# Patient Record
Sex: Female | Born: 1965 | Race: White | Hispanic: No | Smoking: Never smoker
Health system: Southern US, Community
[De-identification: ages and names within clinical notes are randomized; demographics above are authoritative.]

## PROBLEM LIST (undated history)

## (undated) DIAGNOSIS — F32A Depression, unspecified: Secondary | ICD-10-CM

## (undated) DIAGNOSIS — F419 Anxiety disorder, unspecified: Principal | ICD-10-CM

## (undated) HISTORY — DX: Anxiety disorder, unspecified: F41.9

## (undated) HISTORY — DX: Depression, unspecified: F32.A

## (undated) HISTORY — PX: TUBAL LIGATION: SHX77

---

## 1993-05-12 DIAGNOSIS — F419 Anxiety disorder, unspecified: Secondary | ICD-10-CM

## 1993-05-12 HISTORY — DX: Anxiety disorder, unspecified: F41.9

## 2002-08-17 ENCOUNTER — Encounter: Payer: Self-pay | Admitting: Internal Medicine

## 2002-08-17 ENCOUNTER — Ambulatory Visit (HOSPITAL_COMMUNITY): Admission: RE | Admit: 2002-08-17 | Discharge: 2002-08-17 | Payer: Self-pay | Admitting: Internal Medicine

## 2002-10-13 ENCOUNTER — Ambulatory Visit (HOSPITAL_COMMUNITY): Admission: RE | Admit: 2002-10-13 | Discharge: 2002-10-13 | Payer: Self-pay | Admitting: Internal Medicine

## 2002-10-13 ENCOUNTER — Encounter: Payer: Self-pay | Admitting: Internal Medicine

## 2013-11-10 ENCOUNTER — Other Ambulatory Visit (HOSPITAL_COMMUNITY): Payer: Self-pay | Admitting: *Deleted

## 2013-11-10 DIAGNOSIS — Z1231 Encounter for screening mammogram for malignant neoplasm of breast: Secondary | ICD-10-CM

## 2013-11-21 ENCOUNTER — Ambulatory Visit (HOSPITAL_COMMUNITY)
Admission: RE | Admit: 2013-11-21 | Discharge: 2013-11-21 | Disposition: A | Discharge status: Home / Self Care | Payer: PRIVATE HEALTH INSURANCE | Source: Ambulatory Visit | Attending: *Deleted | Admitting: *Deleted

## 2013-11-21 DIAGNOSIS — Z1231 Encounter for screening mammogram for malignant neoplasm of breast: Secondary | ICD-10-CM | POA: Insufficient documentation

## 2015-10-25 ENCOUNTER — Other Ambulatory Visit (HOSPITAL_COMMUNITY): Payer: Self-pay | Admitting: *Deleted

## 2015-10-25 DIAGNOSIS — Z1231 Encounter for screening mammogram for malignant neoplasm of breast: Secondary | ICD-10-CM

## 2015-11-01 ENCOUNTER — Ambulatory Visit (HOSPITAL_COMMUNITY)
Admission: RE | Admit: 2015-11-01 | Discharge: 2015-11-01 | Disposition: A | Discharge status: Home / Self Care | Payer: Self-pay | Source: Ambulatory Visit | Attending: *Deleted | Admitting: *Deleted

## 2015-11-01 DIAGNOSIS — Z1231 Encounter for screening mammogram for malignant neoplasm of breast: Secondary | ICD-10-CM

## 2016-06-09 ENCOUNTER — Emergency Department (HOSPITAL_COMMUNITY): Payer: No Typology Code available for payment source

## 2016-06-09 ENCOUNTER — Encounter (HOSPITAL_COMMUNITY): Payer: Self-pay | Admitting: Emergency Medicine

## 2016-06-09 ENCOUNTER — Emergency Department (HOSPITAL_COMMUNITY)
Admission: EM | Admit: 2016-06-09 | Discharge: 2016-06-09 | Disposition: A | Discharge status: Home / Self Care | Payer: No Typology Code available for payment source | Attending: Emergency Medicine | Admitting: Emergency Medicine

## 2016-06-09 DIAGNOSIS — Y9389 Activity, other specified: Secondary | ICD-10-CM | POA: Diagnosis not present

## 2016-06-09 DIAGNOSIS — S161XXA Strain of muscle, fascia and tendon at neck level, initial encounter: Secondary | ICD-10-CM | POA: Insufficient documentation

## 2016-06-09 DIAGNOSIS — Y92410 Unspecified street and highway as the place of occurrence of the external cause: Secondary | ICD-10-CM | POA: Diagnosis not present

## 2016-06-09 DIAGNOSIS — S39012A Strain of muscle, fascia and tendon of lower back, initial encounter: Secondary | ICD-10-CM | POA: Diagnosis not present

## 2016-06-09 DIAGNOSIS — S199XXA Unspecified injury of neck, initial encounter: Secondary | ICD-10-CM | POA: Diagnosis present

## 2016-06-09 DIAGNOSIS — Y999 Unspecified external cause status: Secondary | ICD-10-CM | POA: Insufficient documentation

## 2016-06-09 MED ORDER — ONDANSETRON HCL 4 MG PO TABS: 4.0000 mg | ORAL_TABLET | Freq: Four times a day (QID) | ORAL | 0 refills | Status: DC

## 2016-06-09 MED ORDER — HYDROCODONE-ACETAMINOPHEN 5-325 MG PO TABS: ORAL_TABLET | ORAL | 0 refills | Status: DC

## 2016-06-09 MED ORDER — METHOCARBAMOL 500 MG PO TABS: 500.0000 mg | ORAL_TABLET | Freq: Two times a day (BID) | ORAL | 0 refills | Status: DC
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
## 2016-06-09 NOTE — Discharge Instructions (Signed)
Apply ice packs on/off to your neck and back.  Follow-up with Dr. Scharlene GlossHAll's office in one week if the symptoms are not improving
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
## 2017-03-03 ENCOUNTER — Ambulatory Visit: Payer: Self-pay | Admitting: Physician Assistant

## 2017-03-05 ENCOUNTER — Ambulatory Visit: Payer: Self-pay | Admitting: Physician Assistant

## 2017-03-05 ENCOUNTER — Encounter: Payer: Self-pay | Admitting: Physician Assistant

## 2017-03-05 VITALS — BP 98/60 | HR 92 | Temp 97.7°F | Ht 60.0 in | Wt 147.2 lb

## 2017-03-05 DIAGNOSIS — Z1239 Encounter for other screening for malignant neoplasm of breast: Secondary | ICD-10-CM

## 2017-03-05 DIAGNOSIS — Z131 Encounter for screening for diabetes mellitus: Secondary | ICD-10-CM

## 2017-03-05 DIAGNOSIS — Z130 Encounter for screening for diseases of the blood and blood-forming organs and certain disorders involving the immune mechanism: Secondary | ICD-10-CM

## 2017-03-05 DIAGNOSIS — F419 Anxiety disorder, unspecified: Secondary | ICD-10-CM

## 2017-03-05 DIAGNOSIS — Z13220 Encounter for screening for lipoid disorders: Secondary | ICD-10-CM

## 2017-03-05 DIAGNOSIS — Z1211 Encounter for screening for malignant neoplasm of colon: Secondary | ICD-10-CM

## 2017-03-05 DIAGNOSIS — M79672 Pain in left foot: Secondary | ICD-10-CM

## 2017-03-05 MED ORDER — CITALOPRAM HYDROBROMIDE 20 MG PO TABS: 20.0000 mg | ORAL_TABLET | Freq: Every day | ORAL | 0 refills | Status: DC

## 2017-03-05 NOTE — Progress Notes (Signed)
BP 98/60 (BP Location: Left Arm, Patient Position: Sitting, Cuff Size: Normal)   Pulse 92   Temp 97.7 F (36.5 C)   Ht 5' (1.524 m)   Wt 147 lb 4 oz (66.8 kg)   SpO2 96%   BMI 28.76 kg/m    Subjective:    Patient ID: Kathryn Koch, female    DOB: 1966/05/04, 51 y.o.   MRN: [id number]  HPI: Kathryn Koch is a 51 y.o. female presenting on 03/05/2017 for New Patient (Initial Visit) ([facility] pt c/o L foot pain when standing on it or walking on it.); Allergies; and Panic Attack   HPI   She [facility] because [facility] never do anything.   Pt with loong hx anxiety.  She went to [facility] and she says it wasn't helpful.  She says the panic attacks and anxiety started after the death of her father.  It's worse at bedtime and in the summer.  Also some feelings of claustrophobia.    She says she was on paxil and something else in the past for that and it just made her feel really bad.     Her last PAP was last year.  Last mammo June 2017  Pt c/o pain L foot due to a bone spur.  States it has been present for years with no improvement despite following conservative recommendations including shoes inserts, avoiding walking barefoot, nsaids.   Also c/o ear problems.    Relevant past medical, surgical, family and social history reviewed and updated as indicated. Interim medical history since our last visit reviewed. Allergies and medications reviewed and updated.  No current outpatient prescriptions on file.  Review of Systems  Constitutional: Positive for diaphoresis and fatigue. Negative for appetite change, chills, fever and unexpected weight change.  HENT: Positive for ear pain and sneezing. Negative for congestion, drooling, facial swelling, hearing loss, mouth sores, sore throat, trouble swallowing and voice change.   Eyes: Positive for itching and visual disturbance. Negative for pain, discharge and redness.  Respiratory: Negative for cough, choking,  shortness of breath and wheezing.   Cardiovascular: Negative for chest pain, palpitations and leg swelling.  Gastrointestinal: Negative for abdominal pain, blood in stool, constipation, diarrhea and vomiting.  Endocrine: Positive for heat intolerance. Negative for cold intolerance and polydipsia.  Genitourinary: Negative for decreased urine volume, dysuria and hematuria.  Musculoskeletal: Positive for back pain and gait problem. Negative for arthralgias.  Skin: Negative for rash.  Allergic/Immunologic: Positive for environmental allergies.  Neurological: Positive for headaches. Negative for seizures, syncope and light-headedness.  Hematological: Negative for adenopathy.  Psychiatric/Behavioral: Negative for agitation, dysphoric mood and suicidal ideas. The patient is not nervous/anxious.     Per HPI unless specifically indicated above     Objective:    BP 98/60 (BP Location: Left Arm, Patient Position: Sitting, Cuff Size: Normal)   Pulse 92   Temp 97.7 F (36.5 C)   Ht 5' (1.524 m)   Wt 147 lb 4 oz (66.8 kg)   SpO2 96%   BMI 28.76 kg/m   Wt Readings from Last 3 Encounters:  03/05/17 147 lb 4 oz (66.8 kg)  06/09/16 135 lb (61.2 kg)    GAD-7 score 21 PHQ-9 score 19  Physical Exam  Constitutional: She is oriented to person, place, and time. She appears well-developed and well-nourished.  HENT:  Head: Normocephalic and atraumatic.  Mouth/Throat: Oropharynx is clear and moist. No oropharyngeal exudate.  Eyes: Pupils are equal, round, and reactive to light. Conjunctivae and EOM are normal.  Neck: Neck supple. No thyromegaly present.  Cardiovascular: Normal rate and regular rhythm.   Pulmonary/Chest: Effort normal and breath sounds normal.  Abdominal: Soft. Bowel sounds are normal. She exhibits no mass. There is no hepatosplenomegaly. There is no tenderness.  Musculoskeletal: She exhibits no edema.       Left foot: There is tenderness.  Tenderness plantar surface L heel   Lymphadenopathy:    She has no cervical adenopathy.  Neurological: She is alert and oriented to person, place, and time. Gait normal.  Skin: Skin is warm and dry.  Psychiatric: She has a normal mood and affect. Her behavior is normal.  Vitals reviewed.   No results found for this or any previous visit.    Assessment & Plan:    Encounter Diagnoses  Name Primary?  Marland Kitchen Anxiety Yes  . Screening cholesterol level   . Screening, anemia, deficiency, iron   . Left foot pain   . Screening for breast cancer   . Screening for colon cancer   . Screening for diabetes mellitus     -ordered screening Mammogram -pt to get Fasting labs drawn tomorrow morning -gave iFOBT for colon cancer screening -pt was given [facility] discount application -ordered Xray L foot.  Will refer to orthopedist at next OV if appropriate -Record request for last PAP from [facility] -discussed anxiety and encouraged pt to call [facility] to arrange appointment with [facility] specialist.  Will start citalopram -will address other pt complaints at next appointment in 3 weeks.  Pt to RTO sooner prn

## 2017-03-06 ENCOUNTER — Other Ambulatory Visit: Payer: Self-pay | Admitting: Physician Assistant

## 2017-03-06 ENCOUNTER — Other Ambulatory Visit (HOSPITAL_COMMUNITY)
Admission: RE | Admit: 2017-03-06 | Discharge: 2017-03-06 | Disposition: A | Discharge status: Home / Self Care | Payer: Self-pay | Source: Ambulatory Visit | Attending: Physician Assistant | Admitting: Physician Assistant

## 2017-03-06 ENCOUNTER — Ambulatory Visit (HOSPITAL_COMMUNITY)
Admission: RE | Admit: 2017-03-06 | Discharge: 2017-03-06 | Disposition: A | Discharge status: Home / Self Care | Payer: Self-pay | Source: Ambulatory Visit | Attending: Physician Assistant | Admitting: Physician Assistant

## 2017-03-06 DIAGNOSIS — Z131 Encounter for screening for diabetes mellitus: Secondary | ICD-10-CM

## 2017-03-06 DIAGNOSIS — Z1211 Encounter for screening for malignant neoplasm of colon: Secondary | ICD-10-CM

## 2017-03-06 DIAGNOSIS — F419 Anxiety disorder, unspecified: Secondary | ICD-10-CM | POA: Insufficient documentation

## 2017-03-06 DIAGNOSIS — M7732 Calcaneal spur, left foot: Secondary | ICD-10-CM | POA: Insufficient documentation

## 2017-03-06 DIAGNOSIS — Z13220 Encounter for screening for lipoid disorders: Secondary | ICD-10-CM

## 2017-03-06 DIAGNOSIS — M79672 Pain in left foot: Secondary | ICD-10-CM | POA: Insufficient documentation

## 2017-03-06 DIAGNOSIS — Z130 Encounter for screening for diseases of the blood and blood-forming organs and certain disorders involving the immune mechanism: Secondary | ICD-10-CM

## 2017-03-06 DIAGNOSIS — F411 Generalized anxiety disorder: Secondary | ICD-10-CM | POA: Insufficient documentation

## 2017-03-06 LAB — CBC
HEMATOCRIT: 41.9 % (ref 36.0–46.0)
HEMOGLOBIN: 14.3 g/dL (ref 12.0–15.0)
MCH: 31.2 pg (ref 26.0–34.0)
MCHC: 34.1 g/dL (ref 30.0–36.0)
MCV: 91.3 fL (ref 78.0–100.0)
Platelets: 227 10*3/uL (ref 150–400)
RBC: 4.59 MIL/uL (ref 3.87–5.11)
RDW: 13.1 % (ref 11.5–15.5)
WBC: 4 10*3/uL (ref 4.0–10.5)

## 2017-03-06 LAB — COMPREHENSIVE METABOLIC PANEL
ALK PHOS: 65 U/L (ref 38–126)
ALT: 25 U/L (ref 14–54)
ANION GAP: 7 (ref 5–15)
AST: 38 U/L (ref 15–41)
Albumin: 4.1 g/dL (ref 3.5–5.0)
BILIRUBIN TOTAL: 0.7 mg/dL (ref 0.3–1.2)
BUN: 18 mg/dL (ref 6–20)
CALCIUM: 9.3 mg/dL (ref 8.9–10.3)
CO2: 27 mmol/L (ref 22–32)
Chloride: 107 mmol/L (ref 101–111)
Creatinine, Ser: 0.84 mg/dL (ref 0.44–1.00)
Glucose, Bld: 105 mg/dL — ABNORMAL HIGH (ref 65–99)
POTASSIUM: 4.4 mmol/L (ref 3.5–5.1)
Sodium: 141 mmol/L (ref 135–145)
TOTAL PROTEIN: 7.5 g/dL (ref 6.5–8.1)

## 2017-03-06 LAB — LIPID PANEL
Cholesterol: 270 mg/dL — ABNORMAL HIGH (ref 0–200)
HDL: 55 mg/dL (ref >40)
LDL Cholesterol: 196 mg/dL — ABNORMAL HIGH (ref 0–99)
TRIGLYCERIDES: 95 mg/dL (ref <150)
Total CHOL/HDL Ratio: 4.9 RATIO
VLDL: 19 mg/dL (ref 0–40)

## 2017-03-06 LAB — TSH: TSH: 1.852 u[IU]/mL (ref 0.350–4.500)

## 2017-03-07 LAB — HEMOGLOBIN A1C
Hgb A1c MFr Bld: 5.8 % — ABNORMAL HIGH (ref 4.8–5.6)
MEAN PLASMA GLUCOSE: 120 mg/dL

## 2017-03-09 ENCOUNTER — Encounter: Payer: Self-pay | Admitting: Physician Assistant

## 2017-03-16 LAB — IFOBT (OCCULT BLOOD): IFOBT: NEGATIVE

## 2017-03-26 ENCOUNTER — Ambulatory Visit: Payer: Self-pay | Admitting: Physician Assistant

## 2017-03-26 ENCOUNTER — Encounter: Payer: Self-pay | Admitting: Physician Assistant

## 2017-03-26 VITALS — BP 98/60 | HR 88 | Temp 97.9°F | Ht 60.0 in | Wt 152.0 lb

## 2017-03-26 DIAGNOSIS — E785 Hyperlipidemia, unspecified: Secondary | ICD-10-CM | POA: Insufficient documentation

## 2017-03-26 DIAGNOSIS — M7732 Calcaneal spur, left foot: Secondary | ICD-10-CM

## 2017-03-26 DIAGNOSIS — F419 Anxiety disorder, unspecified: Secondary | ICD-10-CM

## 2017-03-26 DIAGNOSIS — R7303 Prediabetes: Secondary | ICD-10-CM

## 2017-03-26 DIAGNOSIS — M79672 Pain in left foot: Secondary | ICD-10-CM

## 2017-03-26 DIAGNOSIS — G8929 Other chronic pain: Secondary | ICD-10-CM

## 2017-03-26 DIAGNOSIS — H9202 Otalgia, left ear: Secondary | ICD-10-CM

## 2017-03-26 MED ORDER — AMOXICILLIN 500 MG PO CAPS: 500.0000 mg | ORAL_CAPSULE | Freq: Three times a day (TID) | ORAL | 0 refills | Status: DC

## 2017-03-26 MED ORDER — SIMVASTATIN 20 MG PO TABS: 20.0000 mg | ORAL_TABLET | Freq: Every day | ORAL | 4 refills | Status: DC

## 2017-03-26 MED ORDER — PSEUDOEPHEDRINE HCL ER 240 MG PO TB24: 1.0000 | ORAL_TABLET | Freq: Every day | ORAL | 0 refills | Status: DC

## 2017-03-26 NOTE — Progress Notes (Signed)
BP 98/60 (BP Location: Left Arm, Patient Position: Sitting, Cuff Size: Normal)   Pulse 88   Temp 97.9 F (36.6 C)   Ht 5' (1.524 m)   Wt 152 lb (68.9 kg)   SpO2 98%   BMI 29.69 kg/m    Subjective:    Patient ID: Sallie S Jaeger, female    Tawnya CrookB: 1965-11-01, 51 y.o.   MRN: [id number]  HPI: Tawnya CrookKaren S Darr is a 51 y.o. female presenting on 03/26/2017 for Mental Health Problem; Ear Problem; Foot Pain; and Results (labs)   HPI  -Mammogram ordered last OV  -Pt did not call [facility] for appt.  She says still has contact information Pt has only been on the citalopram for about 5 days because she didn't get her rx until she returned from her trip out of state.   -Pt states L ear hurting since February.  It always feels stopped up but the pain comes and goes.   -pt continues with severe L heel pain  Relevant past medical, surgical, family and social history reviewed and updated as indicated. Interim medical history since our last visit reviewed. Allergies and medications reviewed and updated.   Current Outpatient Medications:  .  citalopram (CELEXA) 20 MG tablet, Take 1 tablet (20 mg total) by mouth daily., Disp: 30 tablet, Rfl: 0 .  loratadine (CLARITIN) 10 MG tablet, Take 10 mg daily by mouth., Disp: , Rfl:   Review of Systems  Constitutional: Positive for diaphoresis and fatigue. Negative for appetite change, chills, fever and unexpected weight change.  HENT: Positive for ear pain, sneezing and sore throat. Negative for congestion, dental problem, drooling, facial swelling, hearing loss, mouth sores, trouble swallowing and voice change.   Eyes: Positive for pain, redness and itching. Negative for discharge and visual disturbance.  Respiratory: Negative for cough, choking, shortness of breath and wheezing.   Cardiovascular: Negative for chest pain, palpitations and leg swelling.  Gastrointestinal: Negative for abdominal pain, blood in stool, constipation, diarrhea and vomiting.  Endocrine:  Positive for heat intolerance. Negative for cold intolerance and polydipsia.  Genitourinary: Negative for decreased urine volume, dysuria and hematuria.  Musculoskeletal: Positive for gait problem. Negative for arthralgias and back pain.  Skin: Negative for rash.  Allergic/Immunologic: Positive for environmental allergies.  Neurological: Positive for headaches. Negative for seizures, syncope and light-headedness.  Hematological: Negative for adenopathy.  Psychiatric/Behavioral: Positive for dysphoric mood. Negative for agitation and suicidal ideas. The patient is nervous/anxious.     Per HPI unless specifically indicated above     Objective:    BP 98/60 (BP Location: Left Arm, Patient Position: Sitting, Cuff Size: Normal)   Pulse 88   Temp 97.9 F (36.6 C)   Ht 5' (1.524 m)   Wt 152 lb (68.9 kg)   SpO2 98%   BMI 29.69 kg/m   Wt Readings from Last 3 Encounters:  03/26/17 152 lb (68.9 kg)  03/05/17 147 lb 4 oz (66.8 kg)  06/09/16 135 lb (61.2 kg)    Physical Exam  Constitutional: She is oriented to person, place, and time. She appears well-developed and well-nourished.  HENT:  Head: Normocephalic and atraumatic.  Right Ear: Hearing, tympanic membrane, external ear and ear canal normal.  Left Ear: Hearing, tympanic membrane, external ear and ear canal normal.  Nose: Nose normal.  Mouth/Throat: Uvula is midline and oropharynx is clear and moist. No oropharyngeal exudate.  Neck: Neck supple.  Cardiovascular: Normal rate and regular rhythm.  Pulmonary/Chest: Effort normal and breath sounds normal.  She has no wheezes.  Abdominal: Soft. Bowel sounds are normal. She exhibits no mass. There is no hepatosplenomegaly. There is no tenderness.  Musculoskeletal: She exhibits no edema.  Lymphadenopathy:    She has no cervical adenopathy.  Neurological: She is alert and oriented to person, place, and time.  Skin: Skin is warm and dry.  Psychiatric: She has a normal mood and affect. Her  behavior is normal.  Vitals reviewed.   Results for orders placed or performed in visit on 03/06/17  IFOBT POC (occult bld, rslt in office)  Result Value Ref Range   IFOBT Negative       Assessment & Plan:   Encounter Diagnoses  Name Primary?  Marland Kitchen. Anxiety Yes  . Hyperlipidemia, unspecified hyperlipidemia type   . Prediabetes   . Otalgia, left ear   . Chronic heel pain, left   . Calcaneal spur of left foot     -reviewed labs with pt -counseled pt and gave [place] information on pre-diabetes -rx simvastatin for lipids.  Counseled pt on hyperlipidemia and gave low-fat diet -refer to orthopedist for chronic and severe heel pain -rx amoxil x 10 days and sudafed x 4 wk for ear -continue citalopram. Reminded pt to contact [facility] for ongoing [facility] health care -screening mammogram was ordered at last OV -pt to follow up 3 weeks to recheck ear and mood on citalopram. RTO sooner prn

## 2017-03-26 NOTE — Patient Instructions (Signed)
Fat and Cholesterol Restricted Diet High levels of fat and cholesterol in your blood may lead to various health problems, such as diseases of the heart, blood vessels, gallbladder, liver, and pancreas. Fats are concentrated sources of energy that come in various forms. Certain types of fat, including saturated fat, may be harmful in excess. Cholesterol is a substance needed by your body in small amounts. Your body makes all the cholesterol it needs. Excess cholesterol comes from the food you eat. When you have high levels of cholesterol and saturated fat in your blood, health problems can develop because the excess fat and cholesterol will gather along the walls of your blood vessels, causing them to narrow. Choosing the right foods will help you control your intake of fat and cholesterol. This will help keep the levels of these substances in your blood within normal limits and reduce your risk of disease. What is my plan? Your health care provider recommends that you:  Limit your fat intake to ______% or less of your total calories per day.  Limit the amount of cholesterol in your diet to less than _________mg per day.  Eat 20-30 grams of fiber each day.  What types of fat should I choose?  Choose healthy fats more often. Choose monounsaturated and polyunsaturated fats, such as olive and canola oil, flaxseeds, walnuts, almonds, and seeds.  Eat more omega-3 fats. Good choices include salmon, mackerel, sardines, tuna, flaxseed oil, and ground flaxseeds. Aim to eat fish at least two times a week.  Limit saturated fats. Saturated fats are primarily found in animal products, such as meats, butter, and cream. Plant sources of saturated fats include palm oil, palm kernel oil, and coconut oil.  Avoid foods with partially hydrogenated oils in them. These contain trans fats. Examples of foods that contain trans fats are stick margarine, some tub margarines, cookies, crackers, and other baked goods. What  general guidelines do I need to follow? These guidelines for healthy eating will help you control your intake of fat and cholesterol:  Check food labels carefully to identify foods with trans fats or high amounts of saturated fat.  Fill one half of your plate with vegetables and green salads.  Fill one fourth of your plate with whole grains. Look for the word "whole" as the first word in the ingredient list.  Fill one fourth of your plate with lean protein foods.  Limit fruit to two servings a day. Choose fruit instead of juice.  Eat more foods that contain fiber, such as apples, broccoli, carrots, beans, peas, and barley.  Eat more home-cooked food and less restaurant, buffet, and fast food.  Limit or avoid alcohol.  Limit foods high in starch and sugar.  Limit fried foods.  Cook foods using methods other than frying. Baking, boiling, grilling, and broiling are all great options.  Lose weight if you are overweight. Losing just 5-10% of your initial body weight can help your overall health and prevent diseases such as diabetes and heart disease.  What foods can I eat? Grains  Whole grains, such as whole wheat or whole grain breads, crackers, cereals, and pasta. Unsweetened oatmeal, bulgur, barley, quinoa, or brown rice. Corn or whole wheat flour tortillas. Vegetables  Fresh or frozen vegetables (raw, steamed, roasted, or grilled). Green salads. Fruits  All fresh, canned (in natural juice), or frozen fruits. Meats and other protein foods  Ground beef (85% or leaner), grass-fed beef, or beef trimmed of fat. Skinless chicken or turkey. Ground chicken or turkey.   Pork trimmed of fat. All fish and seafood. Eggs. Dried beans, peas, or lentils. Unsalted nuts or seeds. Unsalted canned or dry beans. Dairy  Low-fat dairy products, such as skim or 1% milk, 2% or reduced-fat cheeses, low-fat ricotta or cottage cheese, or plain low-fat yo Fats and oils  Tub margarines without trans  fats. Light or reduced-fat mayonnaise and salad dressings. Avocado. Olive, canola, sesame, or safflower oils. Natural peanut or almond butter (choose ones without added sugar and oil). The items listed above may not be a complete list of recommended foods or beverages. Contact your dietitian for more options. Foods to avoid Grains  White bread. White pasta. White rice. Cornbread. Bagels, pastries, and croissants. Crackers that contain trans fat. Vegetables  White potatoes. Corn. Creamed or fried vegetables. Vegetables in a cheese sauce. Fruits  Dried fruits. Canned fruit in light or heavy syrup. Fruit juice. Meats and other protein foods  Fatty cuts of meat. Ribs, chicken wings, bacon, sausage, bologna, salami, chitterlings, fatback, hot dogs, bratwurst, and packaged luncheon meats. Liver and organ meats. Dairy  Whole or 2% milk, cream, half-and-half, and cream cheese. Whole milk cheeses. Whole-fat or sweetened yogurt. Full-fat cheeses. Nondairy creamers and whipped toppings. Processed cheese, cheese spreads, or cheese curds. Beverages  Alcohol. Sweetened drinks (such as sodas, lemonade, and fruit drinks or punches). Fats and oils  Butter, stick margarine, lard, shortening, ghee, or bacon fat. Coconut, palm kernel, or palm oils. Sweets and desserts  Corn syrup, sugars, honey, and molasses. Candy. Jam and jelly. Syrup. Sweetened cereals. Cookies, pies, cakes, donuts, muffins, and ice cream. The items listed above may not be a complete list of foods and beverages to avoid. Contact your dietitian for more information. This information is not intended to replace advice given to you by your health care provider. Make sure you discuss any questions you have with your health care provider. Document Released: 04/28/2005 Document Revised: 05/19/2014 Document Reviewed: 07/27/2013 Elsevier Interactive Patient Education  2017 Else[employer] Inc[employer]     Prediabetes Prediabetes is the condition of  having a blood sugar (blood glucose) level that is higher than it should be, but not high enough for you to be diagnosed with type 2 diabetes. Having prediabetes puts you at risk for developing type 2 diabetes (type 2 diabetes mellitus). Prediabetes may be called impaired glucose tolerance or impaired fasting glucose. Prediabetes usually does not cause symptoms. Your health care provider can diagnose this condition with blood tests. You may be tested for prediabetes if you are overweight and if you have at least one other risk factor for prediabetes. Risk factors for prediabetes include:  Having a family member with type 2 diabetes.  Being overweight or obese.  Being older than age 53.  Being of American-Indian, African-American, Hispanic/Latino, or Asian/Pacific Islander descent.  Having an inactive (sedentary) lifestyle.  Having a history of gestational diabetes or polycystic ovarian syndrome (PCOS).  Having low levels of good cholesterol (HDL-C) or high levels of blood fats (triglycerides).  Having high blood pressure.  What is blood glucose and how is blood glucose measured?  Blood glucose refers to the amount of glucose in your bloodstream. Glucose comes from eating foods that contain sugars and starches (carbohydrates) that the body breaks down into glucose. Your blood glucose level may be measured in mg/dL (milligrams per deciliter) or mmol/L (millimoles per liter).Your blood glucose may be checked with one or more of the following blood tests:  A fasting blood glucose (FBG) test. You will not be allowed  to eat (you will fast) for at least 8 hours before a blood sample is taken. ? A normal range for FBG is 70-100 mg/dl (1.6-1.03.9-5.6 mmol/L).  An A1c (hemoglobin A1c) blood test. This test provides information about blood glucose control over the previous 2?3months.  An oral glucose tolerance test (OGTT). This test measures your blood glucose twice: ? After fasting. This is your  baseline level. ? Two hours after you drink a beverage that contains glucose.  You may be diagnosed with prediabetes:  If your FBG is 100?125 mg/dL (9.6-0.45.6-6.9 mmol/L).  If your A1c level is 5.7?6.4%.  If your OGGT result is 140?199 mg/dL (5.4-097.8-11 mmol/L).  These blood tests may be repeated to confirm your diagnosis. What happens if blood glucose is too high? The pancreas produces a hormone (insulin) that helps move glucose from the bloodstream into cells. When cells in the body do not respond properly to insulin that the body makes (insulin resistance), excess glucose builds up in the blood instead of going into cells. As a result, high blood glucose (hyperglycemia) can develop, which can cause many complications. This is a symptom of prediabetes. What can happen if blood glucose stays higher than normal for a long time? Having high blood glucose for a long time is dangerous. Too much glucose in your blood can damage your nerves and blood vessels. Long-term damage can lead to complications from diabetes, which may include:  Heart disease.  Stroke.  Blindness.  Kidney disease.  Depression.  Poor circulation in the feet and legs, which could lead to surgical removal (amputation) in severe cases.  How can prediabetes be prevented from turning into type 2 diabetes?  To help prevent type 2 diabetes, take the following actions:  Be physically active. ? Do moderate-intensity physical activity for at least 30 minutes on at least 5 days of the week, or as much as told by your health care provider. This could be brisk walking, biking, or water aerobics. ? Ask your health care provider what activities are safe for you. A mix of physical activities may be best, such as walking, swimming, cycling, and strength training.  Lose weight as told by your health care provider. ? Losing 5-7% of your body weight can reverse insulin resistance. ? Your health care provider can determine how much weight  loss is best for you and can help you lose weight safely.  Follow a healthy meal plan. This includes eating lean proteins, complex carbohydrates, fresh fruits and vegetables, low-fat dairy products, and healthy fats. ? Follow instructions from your health care provider about eating or drinking restrictions. ? Make an appointment to see a diet and nutrition specialist (registered dietitian) to help you create a healthy eating plan that is right for you.  Do not smoke or use any tobacco products, such as cigarettes, chewing tobacco, and e-cigarettes. If you need help quitting, ask your health care provider.  Take over-the-counter and prescription medicines as told by your health care provider. You may be prescribed medicines that help lower the risk of type 2 diabetes.  This information is not intended to replace advice given to you by your health care provider. Make sure you discuss any questions you have with your health care provider. Document Released: 08/20/2015 Document Revised: 10/04/2015 Document Reviewed: 06/19/2015 Elsevier Interactive Patient Education  [employer]2018 Else[employer] Inc.

## 2017-04-16 ENCOUNTER — Ambulatory Visit: Payer: Self-pay | Admitting: Physician Assistant

## 2017-04-21 ENCOUNTER — Encounter: Payer: Self-pay | Admitting: Orthopaedic Surgery

## 2017-04-21 ENCOUNTER — Ambulatory Visit (INDEPENDENT_AMBULATORY_CARE_PROVIDER_SITE_OTHER): Payer: Self-pay | Admitting: Orthopaedic Surgery

## 2017-04-21 VITALS — BP 108/73 | HR 101 | Temp 96.3°F | Ht 60.0 in | Wt 150.0 lb

## 2017-04-21 DIAGNOSIS — M79672 Pain in left foot: Secondary | ICD-10-CM

## 2017-04-21 DIAGNOSIS — G8929 Other chronic pain: Secondary | ICD-10-CM

## 2017-04-21 NOTE — Progress Notes (Signed)
Subjective:    Patient ID: Kathryn CrookKaren S Koch, female    DOB: [birth year], 51 y.o.   MRN: [id number]  HPI She has long history of heel pain on the left. She has pain most of the day and night.  She has tried multiple means to help it -- soak, ice, shoe wear changes, inserts, pads, liniments, rubs, heat, Advil, etc with no help.  She has had x-rays in the past showing a heel spur.  She is tired of hurting.  She has no redness, no swelling, no color changes.   Review of Systems  Musculoskeletal: Positive for arthralgias.  All other systems reviewed and are negative.  Past Medical History:  Diagnosis Date  . Anxiety 1995    Past Surgical History:  Procedure Laterality Date  . TUBAL LIGATION      Current Outpatient Medications on File Prior to Visit  Medication Sig Dispense Refill  . citalopram (CELEXA) 20 MG tablet Take 1 tablet (20 mg total) by mouth daily. 30 tablet 0  . loratadine (CLARITIN) 10 MG tablet Take 10 mg daily by mouth.    . simvastatin (ZOCOR) 20 MG tablet Take 1 tablet (20 mg total) at bedtime by mouth. (Patient not taking: Reported on 04/21/2017) 30 tablet 4   No current facility-administered medications on file prior to visit.     Social History   Socioeconomic History  . Marital status: Divorced    Spouse name: Not on file  . Number of children: Not on file  . Years of education: Not on file  . Highest education level: Not on file  Social Needs  . Financial resource strain: Not on file  . Food insecurity - worry: Not on file  . Food insecurity - inability: Not on file  . Transportation needs - medical: Not on file  . Transportation needs - non-medical: Not on file  Occupational History  . Not on file  Tobacco Use  . Smoking status: Never Smoker  . Smokeless tobacco: Never Used  Substance and Sexual Activity  . Alcohol use: No  . Drug use: No  . Sexual activity: Not on file  Other Topics Concern  . Not on file  Social History Narrative  . Not on  file    Family History  Problem Relation Age of Onset  . Hypertension Mother   . Diabetes Mother   . COPD Mother   . Kidney disease Mother   . Cirrhosis Father   . Diabetes Brother   . Hearing loss Daughter     BP 108/73   Pulse (!) 101   Temp (!) 96.3 F (35.7 C)   Ht 5' (1.524 m)   Wt 150 lb (68 kg)   BMI 29.29 kg/m      Objective:   Physical Exam  Constitutional: She is oriented to person, place, and time. She appears well-developed and well-nourished.  HENT:  Head: Normocephalic and atraumatic.  Eyes: Conjunctivae and EOM are normal. Pupils are equal, round, and reactive to light.  Neck: Normal range of motion. Neck supple.  Cardiovascular: Normal rate, regular rhythm and intact distal pulses.  Pulmonary/Chest: Effort normal.  Abdominal: Soft.  Musculoskeletal: She exhibits tenderness (Left heel diffusely tender with point tenderness of mid heel.  NO redness. ROM full but very tight Achilles.).  Neurological: She is alert and oriented to person, place, and time. She displays normal reflexes. No cranial nerve deficit. She exhibits normal muscle tone. Coordination normal.  Skin: Skin is warm and dry.  Psychiatric: She has a normal mood and affect. Her behavior is normal. Judgment and thought content normal.  Vitals reviewed.         Assessment & Plan:   Encounter Diagnosis  Name Primary?  . Heel pain, chronic, left Yes   I have told her about stretching exercises and do them often every day.  She needs to be vigilant about this.  It will take time.  Also, try Aleve one bid pc.  Return in one month.  Call if any problem.  Precautions discussed.   Electronically Signed Darreld McleanWayne Makenly Larabee, MD 12/11/201810:25 AM

## 2017-04-22 ENCOUNTER — Ambulatory Visit: Payer: Self-pay | Admitting: Physician Assistant

## 2017-05-19 ENCOUNTER — Encounter: Payer: Self-pay | Admitting: Orthopaedic Surgery

## 2017-05-19 ENCOUNTER — Ambulatory Visit (INDEPENDENT_AMBULATORY_CARE_PROVIDER_SITE_OTHER): Payer: Self-pay | Admitting: Orthopaedic Surgery

## 2017-05-19 VITALS — BP 123/78 | HR 97 | Temp 97.0°F | Ht 60.0 in | Wt 152.0 lb

## 2017-05-19 DIAGNOSIS — G8929 Other chronic pain: Secondary | ICD-10-CM

## 2017-05-19 DIAGNOSIS — M79672 Pain in left foot: Secondary | ICD-10-CM

## 2017-05-19 MED ORDER — PREDNISONE 5 MG (21) PO TBPK
ORAL_TABLET | ORAL | 0 refills | Status: DC
Start: 2017-05-19 — End: 2023-10-13

## 2017-05-19 NOTE — Progress Notes (Signed)
Patient JY:NWGNF S Schreifels, female DOB:03-20-1966, 52 y.o. AOZ:[id number]  Chief Complaint  Patient presents with  . Foot Pain    LEFT HEEL    HPI  Kathryn Koch is a 52 y.o. female who has continued pain of the left heel area.  She has been doing her stretching exercises.  She says it is worse now.  She has no redness, no swelling. HPI  Body mass index is 29.69 kg/m.  ROS  Review of Systems  Musculoskeletal: Positive for arthralgias.  All other systems reviewed and are negative.   Past Medical History:  Diagnosis Date  . Anxiety 1995    Past Surgical History:  Procedure Laterality Date  . TUBAL LIGATION      Family History  Problem Relation Age of Onset  . Hypertension Mother   . Diabetes Mother   . COPD Mother   . Kidney disease Mother   . Cirrhosis Father   . Diabetes Brother   . Hearing loss Daughter     Social History Social History   Tobacco Use  . Smoking status: Never Smoker  . Smokeless tobacco: Never Used  Substance Use Topics  . Alcohol use: No  . Drug use: No    Allergies  Allergen Reactions  . Codeine     Headache     Current Outpatient Medications  Medication Sig Dispense Refill  . citalopram (CELEXA) 20 MG tablet Take 1 tablet (20 mg total) by mouth daily. 30 tablet 0  . loratadine (CLARITIN) 10 MG tablet Take 10 mg daily by mouth.    . predniSONE (STERAPRED UNI-PAK 21 TAB) 5 MG (21) TBPK tablet Take 6 pills first day; 5 pills second day; 4 pills third day; 3 pills fourth day; 2 pills next day and 1 pill last day. 21 tablet 0  . simvastatin (ZOCOR) 20 MG tablet Take 1 tablet (20 mg total) at bedtime by mouth. (Patient not taking: Reported on 04/21/2017) 30 tablet 4   No current facility-administered medications for this visit.      Physical Exam  Blood pressure 123/78, pulse 97, temperature (!) 97 F (36.1 C), height 5' (1.524 m), weight 152 lb (68.9 kg).  Constitutional: overall normal hygiene, normal nutrition, well developed,  normal grooming, normal body habitus. Assistive device:none  Musculoskeletal: gait and station Limp left, muscle tone and strength are normal, no tremors or atrophy is present.  .  Neurological: coordination overall normal.  Deep tendon reflex/nerve stretch intact.  Sensation normal.  Cranial nerves II-XII intact.   Skin:   Normal overall no scars, lesions, ulcers or rashes. No psoriasis.  Psychiatric: Alert and oriented x 3.  Recent memory intact, remote memory unclear.  Normal mood and affect. Well groomed.  Good eye contact.  Cardiovascular: overall no swelling, no varicosities, no edema bilaterally, normal temperatures of the legs and arms, no clubbing, cyanosis and good capillary refill.  Lymphatic: palpation is normal.  All other systems reviewed and are negative   Left heel is diffusely tender.  She has no redness, no swelling, a limp to the left.  The patient has been educated about the nature of the problem(s) and counseled on treatment options.  The patient appeared to understand what I have discussed and is in agreement with it.  Encounter Diagnosis  Name Primary?  . Heel pain, chronic, left Yes    PLAN Call if any problems.  Precautions discussed.  I will call in prednisone dose pack.  Return to clinic 2 weeks  Electronically Signed Darreld McleanWayne Levana Minetti, MD 1/8/20199:50 AM

## 2017-06-03 ENCOUNTER — Ambulatory Visit (INDEPENDENT_AMBULATORY_CARE_PROVIDER_SITE_OTHER): Payer: Self-pay | Admitting: Orthopaedic Surgery

## 2017-06-03 ENCOUNTER — Encounter: Payer: Self-pay | Admitting: Orthopaedic Surgery

## 2017-06-03 VITALS — BP 117/76 | HR 93 | Ht 60.0 in | Wt 153.0 lb

## 2017-06-03 DIAGNOSIS — G8929 Other chronic pain: Secondary | ICD-10-CM

## 2017-06-03 DIAGNOSIS — M79672 Pain in left foot: Secondary | ICD-10-CM

## 2017-06-03 NOTE — Progress Notes (Signed)
CC:  My heel hurts more  She has continued pain of the left heel.  The prednisone dose pack did not help.  She has pain of the mid lateral heel on the left.  She has no swelling or redness.  Encounter Diagnosis  Name Primary?  . Heel pain, chronic, left Yes   Procedure note: After permission from the patient the heel on the left was prepped.  1% Xylocaine and 1 cc DepoMedrol 40 was instilled into the left heel by sterile technique in the area of most tenderness.  It was tolerated well.  Return in two weeks.  Call if any problem.  Precautions discussed.   Electronically Signed Darreld McleanWayne Harmoni Lucus, MD 1/23/201910:29 AM

## 2017-06-17 ENCOUNTER — Encounter: Payer: Self-pay | Admitting: Orthopaedic Surgery

## 2017-06-17 ENCOUNTER — Ambulatory Visit (INDEPENDENT_AMBULATORY_CARE_PROVIDER_SITE_OTHER): Payer: Self-pay | Admitting: Orthopaedic Surgery

## 2017-06-17 VITALS — BP 121/81 | HR 78 | Ht 60.0 in | Wt 151.0 lb

## 2017-06-17 DIAGNOSIS — G8929 Other chronic pain: Secondary | ICD-10-CM

## 2017-06-17 DIAGNOSIS — M79672 Pain in left foot: Secondary | ICD-10-CM

## 2017-06-17 NOTE — Progress Notes (Signed)
Patient WU:JWJXB:Kathryn Koch, female DOB:[birth year], 52 y.o. JYN:[id number]  Chief Complaint  Patient presents with  . Follow-up    Heel pain, left    HPI  Kathryn CrookKaren S Cardozo is a 52 y.o. female who has continued pain of the left heel.  The injection did not help.  She hurts most of the time.  I will have her go to PT. HPI  Body mass index is 29.49 kg/m.  ROS  Review of Systems  Musculoskeletal: Positive for arthralgias.  All other systems reviewed and are negative.   Past Medical History:  Diagnosis Date  . Anxiety 1995    Past Surgical History:  Procedure Laterality Date  . TUBAL LIGATION      Family History  Problem Relation Age of Onset  . Hypertension Mother   . Diabetes Mother   . COPD Mother   . Kidney disease Mother   . Cirrhosis Father   . Diabetes Brother   . Hearing loss Daughter     Social History Social History   Tobacco Use  . Smoking status: Never Smoker  . Smokeless tobacco: Never Used  Substance Use Topics  . Alcohol use: No  . Drug use: No    Allergies  Allergen Reactions  . Codeine     Headache     Current Outpatient Medications  Medication Sig Dispense Refill  . citalopram (CELEXA) 20 MG tablet Take 1 tablet (20 mg total) by mouth daily. 30 tablet 0  . loratadine (CLARITIN) 10 MG tablet Take 10 mg daily by mouth.    . predniSONE (STERAPRED UNI-PAK 21 TAB) 5 MG (21) TBPK tablet Take 6 pills first day; 5 pills second day; 4 pills third day; 3 pills fourth day; 2 pills next day and 1 pill last day. 21 tablet 0  . simvastatin (ZOCOR) 20 MG tablet Take 1 tablet (20 mg total) at bedtime by mouth. (Patient not taking: Reported on 04/21/2017) 30 tablet 4   No current facility-administered medications for this visit.      Physical Exam  Blood pressure 121/81, pulse 78, height 5' (1.524 m), weight 151 lb (68.5 kg).  Constitutional: overall normal hygiene, normal nutrition, well developed, normal grooming, normal body habitus. Assistive  device:none  Musculoskeletal: gait and station Limp left, muscle tone and strength are normal, no tremors or atrophy is present.  .  Neurological: coordination overall normal.  Deep tendon reflex/nerve stretch intact.  Sensation normal.  Cranial nerves II-XII intact.   Skin:   Normal overall no scars, lesions, ulcers or rashes. No psoriasis.  Psychiatric: Alert and oriented x 3.  Recent memory intact, remote memory unclear.  Normal mood and affect. Well groomed.  Good eye contact.  Cardiovascular: overall no swelling, no varicosities, no edema bilaterally, normal temperatures of the legs and arms, no clubbing, cyanosis and good capillary refill.  Lymphatic: palpation is normal.  Left plantar heel tender, no swelling, no redness.  NV intact.  ROM ankle full.  All other systems reviewed and are negative   The patient has been educated about the nature of the problem(s) and counseled on treatment options.  The patient appeared to understand what I have discussed and is in agreement with it.  Encounter Diagnosis  Name Primary?  . Heel pain, chronic, left Yes    PLAN Call if any problems.  Precautions discussed.  Continue current medications.   Return to clinic 2 weeks   Begin PT  Electronically Signed Darreld McleanWayne Jensyn Shave, MD 2/6/20199:58 AM

## 2017-06-18 ENCOUNTER — Encounter (HOSPITAL_COMMUNITY): Payer: Self-pay | Admitting: Physical Therapy

## 2017-06-18 ENCOUNTER — Other Ambulatory Visit: Payer: Self-pay

## 2017-06-18 ENCOUNTER — Ambulatory Visit (HOSPITAL_COMMUNITY): Payer: Self-pay | Attending: Orthopaedic Surgery | Admitting: Physical Therapy

## 2017-06-18 DIAGNOSIS — R29898 Other symptoms and signs involving the musculoskeletal system: Secondary | ICD-10-CM | POA: Insufficient documentation

## 2017-06-18 DIAGNOSIS — G8929 Other chronic pain: Secondary | ICD-10-CM | POA: Insufficient documentation

## 2017-06-18 DIAGNOSIS — R531 Weakness: Secondary | ICD-10-CM | POA: Insufficient documentation

## 2017-06-18 DIAGNOSIS — M79672 Pain in left foot: Secondary | ICD-10-CM | POA: Insufficient documentation

## 2017-06-18 DIAGNOSIS — R2689 Other abnormalities of gait and mobility: Secondary | ICD-10-CM | POA: Insufficient documentation

## 2017-06-18 NOTE — Therapy (Addendum)
[facility] [address] [place], [place], [address] Phone: [phone]   Fax:  [phone]  Physical Therapy Evaluation  Patient Details  Name: Kathryn Koch MRN: [id number] Date of Birth: Dec 13, 1965 Referring Provider: Darreld McleanKeeling, Wayne, MD   Encounter Date: 06/18/2017  PT End of Session - 06/18/17 1823    Visit Number  1    Number of Visits  17    Date for PT Re-Evaluation  07/16/17    Authorization Type  [employer]    Authorization Time Period  06/18/17 to 08/14/17    PT Start Time  0817    PT Stop Time  0900    PT Time Calculation (min)  43 min    Activity Tolerance  Patient tolerated treatment well;Patient limited by pain    Behavior During Therapy  [facility] for tasks assessed/performed       Past Medical History:  Diagnosis Date  . Anxiety 1995    Past Surgical History:  Procedure Laterality Date  . TUBAL LIGATION      There were no vitals filed for this visit.   Subjective Assessment - 06/18/17 0819    Subjective  Patient reported that she has had pain in her left heel for years. Patient reported that she has a bone spur in left heel that she has had since 2000. Patient reported that it is a throbbing pain that will go from her heel up the front of her leg to her knee and that the pain occurs anytime she is up on her feet and worsens the longer she is up on her feet. Patient denied any presence of tingling or numbness, but reported a burning in her heel at times. Patient reported that at worst her pain can get up to is a 10/10. Patient reported that she has never had physical therapy for this in the past. Patient reported that she has had cortisone shots which she does not feel have helped her pain. Patient denied fever or changes in bowel and bladder function.    Pertinent History  Bone spur indicated by x-ray    Limitations  Sitting;House hold activities;Standing;Walking;Lifting    How long can you sit comfortably?  15-20 minutes  before it starts throbbing    How long can you stand comfortably?  2 minutes    How long can you walk comfortably?  2 minutes    Diagnostic tests  X-ray indicated calcaneal bone spur    Patient Stated Goals  To get some pain relief for the left foot    Currently in Pain?  Yes    Pain Score  6     Pain Location  Heel    Pain Orientation  Left    Pain Descriptors / Indicators  Throbbing    Pain Type  Chronic pain    Pain Radiating Towards  Up the front of left leg to the knee    Pain Onset  More than a month ago    Pain Frequency  Constant    Aggravating Factors   walking, standing, or on her foot whatsoever    Pain Relieving Factors  places foot up on her pillow, or ice    Effect of Pain on Daily Activities  High impact on daily activities    Multiple Pain Sites  No      FOTO score: 21% (79% limitation)  [facility] PT Assessment - 06/18/17 0001      Assessment   Medical Diagnosis  Heel  pain, chronic, left    Referring Provider  Darreld Mclean, MD    Onset Date/Surgical Date  -- Many years ago    Prior Therapy  No      Precautions   Precautions  None      Restrictions   Weight Bearing Restrictions  No      Balance Screen   Has the patient fallen in the past 6 months  Yes    How many times?  1    Has the patient had a decrease in activity level because of a fear of falling?   Yes    Is the patient reluctant to leave their home because of a fear of falling?   Yes      Home Environment   Living Environment  Private residence    Type of Home  House    Home Access  Stairs to enter    Entrance Stairs-Number of Steps  2      Prior Function   Level of Independence  Independent      Cognition   Overall Cognitive Status  Within Functional Limits for tasks assessed      Observation/Other Assessments   Observations  Patient demonstrated decreased arch in bilateral feet, patient demonstrated increased pronation and knee valgus alignment.    Skin Integrity  Skin is intact and no  noted bruising      Sensation   Light Touch  Appears Intact      Squat   Comments  Patient performed 5 squats, the left heel was lifted throughout, and patient's knees passed her toes on each squat      AROM   Right/Left Ankle  Right;Left    Right Ankle Dorsiflexion  -2 Lacks 2 degrees from 0    Right Ankle Plantar Flexion  30    Right Ankle Inversion  35    Right Ankle Eversion  25    Left Ankle Dorsiflexion  -2 Lacking 2 degrees from 0    Left Ankle Plantar Flexion  33    Left Ankle Inversion  33    Left Ankle Eversion  30      Strength   Right/Left Hip  Right;Left    Right Hip Flexion  5/5    Right Hip Extension  4/5    Right Hip ABduction  4+/5    Left Hip Flexion  5/5    Left Hip Extension  4/5    Left Hip ABduction  4+/5    Right/Left Knee  Right;Left    Right Knee Flexion  5/5    Right Knee Extension  5/5    Left Knee Flexion  5/5    Left Knee Extension  5/5    Right/Left Ankle  Right;Left    Right Ankle Dorsiflexion  4+/5    Right Ankle Plantar Flexion  2+/5 5 heel raises partial range. Resists max force gravity elim.    Right Ankle Inversion  5/5    Right Ankle Eversion  5/5    Left Ankle Dorsiflexion  4+/5    Left Ankle Plantar Flexion  2+/5 0 heel raises in standing. Resists max force gravity elim.    Left Ankle Inversion  5/5    Left Ankle Eversion  5/5      Palpation   Palpation comment  Patient was tender to palpation at calcaneal tuberosity, and all around the plantar surface of patient's left heel. Noted fascial restrictions in plantar surface of left heel, and muscular restrictions in bilateral  calf muscles      Thompson's Test   Findings  Negative    Side  Right;Left      Provocative Tinel's test    Findings  Negative    Side  Left    Comments  Patient did not have increased pain with this      Ambulation/Gait   Ambulation Distance (Feet)  310 Feet    Ambulation Surface  Level    Gait velocity  0.79 m/s    Stair Management Technique  --  Patient ascended and descended stairs with left heel raised    Number of Stairs  4    Height of Stairs  6 inches    Gait Comments  Patient ambulated with left heel lifted, decreased stance time on the left lower extremity, decreased step length on the right lower extremity      Static Standing Balance   Static Standing Balance -  Activities   Single Leg Stance - Right Leg;Single Leg Stance - Left Leg    Static Standing - Comment/# of Minutes  Right for 3 seconds, left for 1 second             Objective measurements completed on examination: See above findings.              PT Education - 06/18/17 2029    Education provided  Yes    Education Details  Patient was educated about examination findings and plan of care.     Person(s) Educated  Patient    Methods  Explanation    Comprehension  Verbalized understanding       PT Short Term Goals - 06/18/17 2113      PT SHORT TERM GOAL #1   Title  Patient will demonstrate understanding and report regular compliance of home exercise program.     Time  4    Period  Weeks    Status  New    Target Date  07/16/17      PT SHORT TERM GOAL #2   Title  Patient will demonstrate improvement by 1/2 MMT grade in ankle plantarflexion as evidence of improved plantarflexion strength in order to assist with gait.     Baseline  Patient demonstrated a 2+/5 MMT grade strength in ankle plantarflexion bilaterally.     Time  4    Period  Weeks    Status  New    Target Date  07/16/17      PT SHORT TERM GOAL #3   Title  Patient will report no greater than a 5/10 pain over the course of a one week period indicating better tolerance to daily activities.     Baseline  Patient reported a 6/10 pain at evaluation and a 10/10 pain maximum.     Time  4    Period  Weeks    Status  New    Target Date  07/16/17      PT SHORT TERM GOAL #4   Title  Patient will report ability to stand for 10 minutes without an increase in pain symptoms as evidence  of ability to tolerate and perform at home activities.    Baseline  Patient reported that she can stand for 2 minutes before having an increase in pain symptoms.     Time  4    Period  Weeks    Status  New      PT SHORT TERM GOAL #5   Title  Patient will demonstrate ability to  maintain single limb stance for 3 seconds on left lower extremity in order to assist with stair navigation.     Baseline  Patient performed single limb stance on the the left lower extremity for 1 second.     Time  4    Period  Weeks    Status  New    Target Date  07/16/17        PT Long Term Goals - 06/18/17 2123      PT LONG TERM GOAL #1   Title  Patient will ambulate 500 feet with bilateral heel strike at a velocity of 1.0 m/s or greater.    Baseline  Patient ambulated 310 feet at a velocity of 0.79 m/s with left midfoot strike rather than heel strike throughout.     Time  8    Period  Weeks    Status  New    Target Date  08/13/17      PT LONG TERM GOAL #2   Title  Patient will report no greater than 2/10 pain over the course of a 1 week period as evidence of improved tolerance to daily activities.    Baseline  Patient reported a 6/10 pain at evaluation and a 10/10 pain maximum.     Time  8    Period  Weeks    Status  New    Target Date  08/13/17      PT LONG TERM GOAL #3   Title  Patient will demonstrate improvement by 1 MMT grade in ankle plantarflexion as evidence of improved plantarflexion strength in order to assist with gait.     Baseline   Patient demonstrated a 2+/5 MMT grade strength in ankle plantarflexion bilaterally.    Time  8    Period  Weeks    Status  New    Target Date  08/13/17      PT LONG TERM GOAL #4   Title  Patient will report ability to stand for 20 minutes without an increase in pain symptoms as evidence of ability to tolerate and perform at home activities.    Baseline  Patient reported that she can stand for 2 minutes before having an increase in pain symptoms.      Time  8    Period  Weeks    Status  New    Target Date  08/13/17             Plan - 06/18/17 2158    Clinical Impression Statement  Patient is a 52 year old female who presented to physical therapy with complaints of many years of left heel pain. Upon examination, patient demonstrated decreased ankle dorsiflexion range of motion limited both with knee bent and with knee straight. Patient also demonstrated decreased ankle plantarflexion and dorsiflexion strength bilaterally, hip extension strength, and hip abduction strength. Patient demonstrated decreased heel contact with all functional testing including ambulation over level-ground, stair ambulation, and during squatting. Patient also demonstrated decreased ability to balance as patient maintained single limb stance on the left lower extremity for 1 second and for 3 seconds on the right lower extremity. Also, with ambulation patient demonstrated a below age-expected norm for velocity. Patient reported that she has pain that can reach a 10/10 at times, and the pain is strongly impacting her ability to perform functional activities. Patient reported that she can only stand or walk comfortably for 2 minutes before having an increase in pain. Patient would benefit from skilled physical therapy in order  to address the abovementioned deficits, specifically to reach patient's goal of having decreased pain and overall improved tolerance to functional activities.     History and Personal Factors relevant to plan of care:  Bone spur in left heel    Clinical Presentation  Stable    Clinical Presentation due to:  MMT, ROM, Gait velocity, and clinical judgement    Clinical Decision Making  Low    Rehab Potential  Fair    Clinical Impairments Affecting Rehab Potential  Positive: Patient's support system; patient's motivation; patient's positive attitude. Negative: Chronicity of issue; irritiability of pain    PT Frequency  2x / week    PT Duration  8 weeks     PT Treatment/Interventions  ADLs/Self Care Home Management;Aquatic Therapy;Cryotherapy;Electrical Stimulation;Iontophoresis 4mg /ml Dexamethasone;Moist Heat;Ultrasound;Gait training;Stair training;Functional mobility training;Therapeutic activities;Therapeutic exercise;Balance training;Neuromuscular re-education;Patient/family education;Orthotic Fit/Training;Manual techniques;Passive range of motion    PT Next Visit Plan  FOTO, Abuse/language/literacy, initiate HEP including gastrocnemius and soleus stretches, exercises to strengthen foot arch, hip strengthening    PT Home Exercise Plan  Told patient to continue gastrocnemius stretch from physician    Consulted and Agree with Plan of Care  Patient       Patient will benefit from skilled therapeutic intervention in order to improve the following deficits and impairments:  Abnormal gait, Increased fascial restricitons, Improper body mechanics, Pain, Decreased mobility, Decreased activity tolerance, Decreased endurance, Decreased range of motion, Decreased strength, Decreased balance, Difficulty walking, Impaired flexibility  Visit Diagnosis: Chronic heel pain, left  Decreased strength  Other abnormalities of gait and mobility  Other symptoms and signs involving the musculoskeletal system  Impairment of balance     Problem List Patient Active Problem List   Diagnosis Date Noted  . Hyperlipidemia 03/26/2017  . Prediabetes 03/26/2017  . Anxiety 03/06/2017  . Left foot pain 03/06/2017   Verne Carrow PT, DPT 10:10 PM, 06/18/17 [phone]  [facility] [address] [place], [place], [address] Phone: [phone]   Fax:  [phone]  Name: Kathryn Koch MRN: [id number] Date of Birth: 02-21-1966

## 2017-06-19 NOTE — Addendum Note (Signed)
Addended by: Verne CarrowGILL, Demorris Choyce on: 06/19/2017 08:59 AM   Modules accepted: Orders

## 2017-06-24 ENCOUNTER — Ambulatory Visit (HOSPITAL_COMMUNITY): Payer: Self-pay | Admitting: Physical Therapy

## 2017-06-24 ENCOUNTER — Encounter (HOSPITAL_COMMUNITY): Payer: Self-pay | Admitting: Physical Therapy

## 2017-06-24 ENCOUNTER — Other Ambulatory Visit: Payer: Self-pay

## 2017-06-24 DIAGNOSIS — R531 Weakness: Secondary | ICD-10-CM

## 2017-06-24 DIAGNOSIS — M79672 Pain in left foot: Principal | ICD-10-CM

## 2017-06-24 DIAGNOSIS — R29898 Other symptoms and signs involving the musculoskeletal system: Secondary | ICD-10-CM

## 2017-06-24 DIAGNOSIS — G8929 Other chronic pain: Secondary | ICD-10-CM

## 2017-06-24 DIAGNOSIS — R2689 Other abnormalities of gait and mobility: Secondary | ICD-10-CM

## 2017-06-24 NOTE — Therapy (Signed)
[facility] [address] [place], [place], [address] Phone: [phone]   Fax:  [phone]  Physical Therapy Treatment  Patient Details  Name: IRENA GAYDOS MRN: [id number] Date of Birth: 1965-10-30 Referring Provider: Darreld Mclean, MD   Encounter Date: 06/24/2017  PT End of Session - 06/24/17 1006    Visit Number  2    Number of Visits  17    Date for PT Re-Evaluation  07/16/17    Authorization Type  [employer]    Authorization Time Period  06/18/17 to 08/14/17    PT Start Time  0954    PT Stop Time  1035    PT Time Calculation (min)  41 min    Activity Tolerance  Patient tolerated treatment well;Patient limited by pain    Behavior During Therapy  [facility] for tasks assessed/performed       Past Medical History:  Diagnosis Date  . Anxiety 1995    Past Surgical History:  Procedure Laterality Date  . TUBAL LIGATION      There were no vitals filed for this visit.  Subjective Assessment - 06/24/17 0956    Subjective  Patient reported that her heel pain was increased after last session due to all the activity performed, but patient stated that eventually it decreased.     Pertinent History  Bone spur indicated by x-ray    Limitations  Sitting;House hold activities;Standing;Walking;Lifting    How long can you sit comfortably?  15-20 minutes before it starts throbbing    How long can you stand comfortably?  2 minutes    How long can you walk comfortably?  2 minutes    Diagnostic tests  X-ray indicated calcaneal bone spur    Patient Stated Goals  To get some pain relief for the left foot    Currently in Pain?  Yes    Pain Score  6     Pain Location  Heel    Pain Orientation  Left    Pain Descriptors / Indicators  Throbbing    Pain Type  Chronic pain    Pain Onset  More than a month ago    Pain Frequency  Constant    Multiple Pain Sites  No                      [facility] Adult PT Treatment/Exercise - 06/24/17 0001       Ambulation/Gait   Ambulation Distance (Feet)  390 Feet    Ambulation Surface  Level    Gait Comments  Verbal cueing to try to perform with heel strike on left foot      Knee/Hip Exercises: Sidelying   Hip ABduction  Strengthening;Right;Left;2 sets;10 reps    Hip ABduction Limitations  Verbal cues to keep knee straight and hip rolled forward      Knee/Hip Exercises: Prone   Hip Extension  Right;Strengthening;Left;2 sets;10 reps    Hip Extension Limitations  Verbal cues to keep knee straight throughout       Manual Therapy   Manual Therapy  Soft tissue mobilization    Manual therapy comments  Manual therapy performed seperately from all other skilled interventions.    Soft tissue mobilization  Therapist provided soft tissue mobilization to patient's left foot plantar fascia and intrinsic musculature in order to decrease restrictions, decrease pain, and promote relaxation. Patient positioned supine with knee over bolster. 10 minutes total      Ankle Exercises: Stretches  Soleus Stretch  3 reps;30 seconds;Limitations    Soleus Stretch Limitations  Supine with towel    Gastroc Stretch  3 reps;30 seconds    Gastroc Stretch Limitations  Supine with towel      Ankle Exercises: Seated   Other Seated Ankle Exercises  Intrinsic foot muscle strengthening lifting toes and arch of foot and then maintaining arch for 3 seconds; 2 x 10              PT Education - 06/24/17 1140    Education provided  Yes    Education Details  Patient was educated about evaluation, and about home exercise program as well as proper form and technique of exercises throughout session.     Person(s) Educated  Patient    Methods  Explanation;Demonstration;Tactile cues;Verbal cues;Handout    Comprehension  Verbalized understanding;Returned demonstration;Need further instruction       PT Short Term Goals - 06/18/17 2113      PT SHORT TERM GOAL #1   Title  Patient will demonstrate understanding and report  regular compliance of home exercise program.     Time  4    Period  Weeks    Status  New    Target Date  07/16/17      PT SHORT TERM GOAL #2   Title  Patient will demonstrate improvement by 1/2 MMT grade in ankle plantarflexion as evidence of improved plantarflexion strength in order to assist with gait.     Baseline   Patient demonstrated a 2+/5 MMT grade strength in ankle plantarflexion bilaterally.     Time  4    Period  Weeks    Status  New    Target Date  07/16/17      PT SHORT TERM GOAL #3   Title  Patient will report no greater than a 5/10 pain over the course of a one week period indicating better tolerance to daily activities.     Baseline  Patient reported a 6/10 pain at evaluation and a 10/10 pain maximum.     Time  4    Period  Weeks    Status  New    Target Date  07/16/17      PT SHORT TERM GOAL #4   Title  Patient will report ability to stand for 10 minutes without an increase in pain symptoms as evidence of ability to tolerate and perform at home activities.    Baseline  Patient reported that she can stand for 2 minutes before having an increase in pain symptoms.     Time  4    Period  Weeks    Status  New      PT SHORT TERM GOAL #5   Title  Patient will demonstrate ability to maintain single limb stance for 3 seconds on left lower extremity in order to assist with stair navigation.     Baseline  Patient performed single limb stance on the the left lower extremity for 1 second.     Time  4    Period  Weeks    Status  New    Target Date  07/16/17        PT Long Term Goals - 06/18/17 2123      PT LONG TERM GOAL #1   Title  Patient will ambulate 500 feet with bilateral heel strike at a velocity of 1.0 m/s or greater.    Baseline  Patient ambulated 310 feet at a velocity of 0.79 m/s with  left midfoot strike rather than heel strike throughout.     Time  8    Period  Weeks    Status  New    Target Date  08/13/17      PT LONG TERM GOAL #2   Title  Patient  will report no greater than 2/10 pain over the course of a 1 week period as evidence of improved tolerance to daily activities.    Baseline  Patient reported a 6/10 pain at evaluation and a 10/10 pain maximum.     Time  8    Period  Weeks    Status  New    Target Date  08/13/17      PT LONG TERM GOAL #3   Title  Patient will demonstrate improvement by 1 MMT grade in ankle plantarflexion as evidence of improved plantarflexion strength in order to assist with gait.     Baseline   Patient demonstrated a 2+/5 MMT grade strength in ankle plantarflexion bilaterally.     Time  8    Period  Weeks    Status  New    Target Date  08/13/17      PT LONG TERM GOAL #4   Title  Patient will report ability to stand for 20 minutes without an increase in pain symptoms as evidence of ability to tolerate and perform at home activities.    Baseline  Patient reported that she can stand for 2 minutes before having an increase in pain symptoms.     Time  8    Period  Weeks    Status  New    Target Date  08/13/17            Plan - 06/24/17 1156    Clinical Impression Statement  This session began with a review of patient's evaluation and goals. Then, the patient performed ambulation for 2 minutes with gait training cues from therapist primarily to perform heel strike with each step on the left lower extremity. Then the therapist performed soft tissue mobilization to the patient's left foot plantar fascia and intrinsic foot musculature in order to reduce restrictions, promote relaxation, and reduce pain. Therapist noted that there is a bundle of tight tissue in patient's left heel. Patient reported that the soft tissue mobilization was not painful as the therapist was performing it. Finally, the session ended with the therapist educating the patient on exercises and performing them in the session as well as giving patient a handout to perform exercises at home. Patient tolerated exercises well and therapist  instructed patient to let her know next session how progress with home exercises are going and that if they aggravate her pain more to discontinue until the next therapy session. Patient would continue to benefit from skilled physical therapy in order to address patient's deficits in pain, range of motion, strength, and overall functional mobility.     History and Personal Factors relevant to plan of care:  Bone spur in left heel     Rehab Potential  Fair    Clinical Impairments Affecting Rehab Potential  Positive: Patient's support system; patient's motivation; patient's positive attitude. Negative: Chronicity of issue; irritiability of pain    PT Frequency  2x / week    PT Duration  8 weeks    PT Treatment/Interventions  ADLs/Self Care Home Management;Aquatic Therapy;Cryotherapy;Electrical Stimulation;Iontophoresis 4mg /ml Dexamethasone;Moist Heat;Ultrasound;Gait training;Stair training;Functional mobility training;Therapeutic activities;Therapeutic exercise;Balance training;Neuromuscular re-education;Patient/family education;Orthotic Fit/Training;Manual techniques;Passive range of motion    PT Next Visit Plan  Abuse/language/literacy, Follow-up about HEP; continue manual therapy to left foot if patient tolerated well; continue progress of lower extremity strengthening    PT Home Exercise Plan  Gastroc stretch 3x30 seconds supine with towel 2x/day; soleus stretch supine with towel 3 x 30 seconds 2x/day; sidelying hip abduction 2 x 10 each lower extremity 1x/day; prone hip extension 2 x10 each lower extremity 1x/day; foot intrinsic strengthening 3 second holds 2 x 10 1x/day    Consulted and Agree with Plan of Care  Patient       Patient will benefit from skilled therapeutic intervention in order to improve the following deficits and impairments:  Abnormal gait, Increased fascial restricitons, Improper body mechanics, Pain, Decreased mobility, Decreased activity tolerance, Decreased endurance, Decreased  range of motion, Decreased strength, Decreased balance, Difficulty walking, Impaired flexibility  Visit Diagnosis: Chronic heel pain, left  Decreased strength  Other abnormalities of gait and mobility  Other symptoms and signs involving the musculoskeletal system  Impairment of balance     Problem List Patient Active Problem List   Diagnosis Date Noted  . Hyperlipidemia 03/26/2017  . Prediabetes 03/26/2017  . Anxiety 03/06/2017  . Left foot pain 03/06/2017   Verne Carrow PT, DPT 12:10 PM, 06/24/17 [phone]  [facility] [address] [place], [place], [address] Phone: [phone]   Fax:  [phone]  Name: SHIRON WHETSEL MRN: [id number] Date of Birth: Mar 11, 1966

## 2017-06-24 NOTE — Patient Instructions (Signed)
  Soleus stretch supine (distal muscle belly) Lie supine. Place strap around foot. Place hip to 90 degree hip flexion position. while pulling on strap into maximal dorsiflexion, try to straighten knee. Often times, in abnormally tight soleus, this stretch can be Uncomfortable Repeat 3 Times Hold 30 Seconds Complete 1 Set Perform 2 Time(s) a Day   Gastrocnemius Stretch Sitting with your back straight against a wall and both legs outstretched in front of you, wrap a towel or belt around the ball of your foot. Maintaining a straight back and without slouching, pulling your foot and the towel toward your body for a stretch on the back of your calf. Hold this stretch statically for 30 Seconds. Hold 30 Seconds Complete 3 Sets Perform 2 Time(s) a Day   HIP ABDUCTION - SIDELYING While lying on your side, slowly raise up your top leg to the side. Keep your knee straight and maintain your toes pointed forward the entire time. Keep your leg in-line with your body. The bottom leg can be bent to stabilize your body. Each leg 2 x 10 Repeat 10 Times Hold 1 Second Complete 2 Sets Perform 1 Time(s) a Day   Intrinsic Foot Muscle Strengthening Starting position: Sitting or Standing with the base of your first toe, the base of your fifth toe and your heel touching the ground. 1) Lift your toes as high as possible off the ground as shown. The arch of you foot will naturally also rise. Try to feel the muscles squeezing in the arch of your foot. Keep the bases of your first and fifth toes touching the ground 2) Slowly lower your toes, without lowering your arch. Hold just the arch up for 2-3sec each rep. Practice this a few times as you get familiar with the feel of you foot in these Position.  Repeat 10 Times Hold 3 Seconds Complete 2 Sets Perform 1 Time(s) a Day    PRONE HIP EXTENSION While lying face down with your knee straight, slowly raise up leg off the  ground. Maintain a straight knee the entire time. Each Lower extremity  Repeat 10 Times Hold 1 Second Complete 2 Sets Perform 1 Time(s) a Day

## 2017-06-25 ENCOUNTER — Telehealth (HOSPITAL_COMMUNITY): Payer: Self-pay

## 2017-06-25 NOTE — Telephone Encounter (Signed)
Patient left a message to cancel her appt for today. °

## 2017-06-26 ENCOUNTER — Ambulatory Visit (HOSPITAL_COMMUNITY): Payer: Self-pay

## 2017-06-30 ENCOUNTER — Ambulatory Visit (HOSPITAL_COMMUNITY): Payer: Self-pay | Admitting: Physical Therapy

## 2017-06-30 ENCOUNTER — Encounter (HOSPITAL_COMMUNITY): Payer: Self-pay | Admitting: Physical Therapy

## 2017-06-30 DIAGNOSIS — R29898 Other symptoms and signs involving the musculoskeletal system: Secondary | ICD-10-CM

## 2017-06-30 DIAGNOSIS — G8929 Other chronic pain: Secondary | ICD-10-CM

## 2017-06-30 DIAGNOSIS — R2689 Other abnormalities of gait and mobility: Secondary | ICD-10-CM

## 2017-06-30 DIAGNOSIS — R531 Weakness: Secondary | ICD-10-CM

## 2017-06-30 DIAGNOSIS — M79672 Pain in left foot: Principal | ICD-10-CM

## 2017-06-30 NOTE — Therapy (Signed)
[facility] [address] [place], [place], [address] Phone: [phone]   Fax:  [phone]  Physical Therapy Treatment  Patient Details  Name: Kathryn Koch MRN: [id number] Date of Birth: 05-09-1966 Referring Provider: Darreld Mclean, MD   Encounter Date: 06/30/2017  PT End of Session - 06/30/17 1011    Visit Number  3    Number of Visits  17    Date for PT Re-Evaluation  07/16/17    Authorization Type  [employer]    Authorization Time Period  06/18/17 to 08/14/17    PT Start Time  0820    PT Stop Time  0904    PT Time Calculation (min)  44 min    Activity Tolerance  Patient tolerated treatment well;Patient limited by pain    Behavior During Therapy  [facility] for tasks assessed/performed       Past Medical History:  Diagnosis Date  . Anxiety 1995    Past Surgical History:  Procedure Laterality Date  . TUBAL LIGATION      There were no vitals filed for this visit.  Subjective Assessment - 06/30/17 0826    Subjective  Patient reported that today her pain is 7/10 and it is in the left heel and around first MTP. Patient reported that she has gained 10 pounds and that she does have night sweats.     Currently in Pain?  Yes    Pain Score  7     Pain Location  Foot    Pain Orientation  Left    Pain Descriptors / Indicators  Throbbing    Pain Onset  More than a month ago         [facility] PT Assessment - 06/30/17 0001      Special Tests   Other special tests  Neural tension tests to left lower extremity - Tibial Nerve: positive, Sural nerve: negative, Peroneal Nerve: negative                  [facility] Adult PT Treatment/Exercise - 06/30/17 0001      Knee/Hip Exercises: Standing   Heel Raises  2 sets;Both;10 reps;Limitations    Heel Raises Limitations  On 2 inch step with eccentric and concentric phase    Hip Extension  Stengthening;Right;Left;2 sets;10 reps;Knee straight    Extension Limitations  First set without  weight second set with 2 pound ankle weights      Knee/Hip Exercises: Supine   Other Supine Knee/Hip Exercises  Clamshells with red theraband 2 x 10  Verbal cues to move through full range       Manual Therapy   Manual Therapy  Soft tissue mobilization;Neural Stretch    Manual therapy comments  Manual therapy performed seperately from all other skilled interventions.    Soft tissue mobilization  Therapist provided soft tissue mobilization to patient's left foot plantar fascia and intrinsic musculature in order to decrease restrictions, decrease pain, and promote relaxation. Patient positioned supine with knee over bolster. 20 minutes total    Neural Stretch  Tibial nerve manual stretch to left lower extremity supine with hip flexed, knee extended ankle dorsiflexed 10 second holds for 4 minutes total       Ankle Exercises: Stretches   Soleus Stretch  Other (comment) 6 x15 seconds left lower extremity standing    Gastroc Stretch  Other (comment) 6 x15 seconds left lower extremity standing      Ankle Exercises: Seated   ABC's  1  rep;Other (comment) Seated             PT Education - 06/30/17 1010    Education provided  Yes    Education Details  Patient was educated about purpose of interventions throughout session and proper technique of exercises throughout.     Person(s) Educated  Patient    Methods  Explanation;Demonstration;Tactile cues;Verbal cues    Comprehension  Verbalized understanding;Returned demonstration       PT Short Term Goals - 06/18/17 2113      PT SHORT TERM GOAL #1   Title  Patient will demonstrate understanding and report regular compliance of home exercise program.     Time  4    Period  Weeks    Status  New    Target Date  07/16/17      PT SHORT TERM GOAL #2   Title  Patient will demonstrate improvement by 1/2 MMT grade in ankle plantarflexion as evidence of improved plantarflexion strength in order to assist with gait.     Baseline   Patient  demonstrated a 2+/5 MMT grade strength in ankle plantarflexion bilaterally.     Time  4    Period  Weeks    Status  New    Target Date  07/16/17      PT SHORT TERM GOAL #3   Title  Patient will report no greater than a 5/10 pain over the course of a one week period indicating better tolerance to daily activities.     Baseline  Patient reported a 6/10 pain at evaluation and a 10/10 pain maximum.     Time  4    Period  Weeks    Status  New    Target Date  07/16/17      PT SHORT TERM GOAL #4   Title  Patient will report ability to stand for 10 minutes without an increase in pain symptoms as evidence of ability to tolerate and perform at home activities.    Baseline  Patient reported that she can stand for 2 minutes before having an increase in pain symptoms.     Time  4    Period  Weeks    Status  New      PT SHORT TERM GOAL #5   Title  Patient will demonstrate ability to maintain single limb stance for 3 seconds on left lower extremity in order to assist with stair navigation.     Baseline  Patient performed single limb stance on the the left lower extremity for 1 second.     Time  4    Period  Weeks    Status  New    Target Date  07/16/17        PT Long Term Goals - 06/18/17 2123      PT LONG TERM GOAL #1   Title  Patient will ambulate 500 feet with bilateral heel strike at a velocity of 1.0 m/s or greater.    Baseline  Patient ambulated 310 feet at a velocity of 0.79 m/s with left midfoot strike rather than heel strike throughout.     Time  8    Period  Weeks    Status  New    Target Date  08/13/17      PT LONG TERM GOAL #2   Title  Patient will report no greater than 2/10 pain over the course of a 1 week period as evidence of improved tolerance to daily activities.    Baseline  Patient reported a 6/10 pain at evaluation and a 10/10 pain maximum.     Time  8    Period  Weeks    Status  New    Target Date  08/13/17      PT LONG TERM GOAL #3   Title  Patient will  demonstrate improvement by 1 MMT grade in ankle plantarflexion as evidence of improved plantarflexion strength in order to assist with gait.     Baseline   Patient demonstrated a 2+/5 MMT grade strength in ankle plantarflexion bilaterally.     Time  8    Period  Weeks    Status  New    Target Date  08/13/17      PT LONG TERM GOAL #4   Title  Patient will report ability to stand for 20 minutes without an increase in pain symptoms as evidence of ability to tolerate and perform at home activities.    Baseline  Patient reported that she can stand for 2 minutes before having an increase in pain symptoms.     Time  8    Period  Weeks    Status  New    Target Date  08/13/17            Plan - 06/30/17 1012    Clinical Impression Statement  This session continued to incorporate a heavy focus on manual therapy in order to promote relaxation, reduce pain, and improve mobility of tissues. This session therapist performed neural tension tests of left lower extremity for tibial nerve, sural nerve, and peroneal nerve. Patient was positive for tibial nerve test. Therapist provided manual nerve stretch this session to address this. Then patient progressed to exercises with a focus on progressing patient with weight bearing through left ankle slowly. Therapist provided education on slowly increasing the amount of time with weight bearing through left ankle to desensitize the area. Patient required demonstration, verbal cues, and tactile cues throughout for exercises. Patient had informed therapist about some weight gain and some night sweats which therapist urged the patient to follow-up with physician about and report back to therapist. Patient would continue to benefit from skilled physical therapy in order to address deficits in range of motion, strength, pain, and overall functional mobility.     History and Personal Factors relevant to plan of care:  Bonr spur in left heel     Rehab Potential  Fair     Clinical Impairments Affecting Rehab Potential  Positive: Patient's support system; patient's motivation; patient's positive attitude. Negative: Chronicity of issue; irritiability of pain    PT Frequency  2x / week    PT Duration  8 weeks    PT Treatment/Interventions  ADLs/Self Care Home Management;Aquatic Therapy;Cryotherapy;Electrical Stimulation;Iontophoresis 4mg /ml Dexamethasone;Moist Heat;Ultrasound;Gait training;Stair training;Functional mobility training;Therapeutic activities;Therapeutic exercise;Balance training;Neuromuscular re-education;Patient/family education;Orthotic Fit/Training;Manual techniques;Passive range of motion    PT Next Visit Plan  Abuse/language/literacy, continue manual therapy to left foot if patient tolerated well; continue progress of lower extremity strengthening progressing amount of weight bearing time on left heel gradually     PT Home Exercise Plan  Gastroc stretch 3x30 seconds supine with towel 2x/day; soleus stretch supine with towel 3 x 30 seconds 2x/day; sidelying hip abduction 2 x 10 each lower extremity 1x/day; prone hip extension 2 x10 each lower extremity 1x/day; foot intrinsic strengthening 3 second holds 2 x 10 1x/day    Consulted and Agree with Plan of Care  Patient       Patient will benefit from  skilled therapeutic intervention in order to improve the following deficits and impairments:  Abnormal gait, Increased fascial restricitons, Improper body mechanics, Pain, Decreased mobility, Decreased activity tolerance, Decreased endurance, Decreased range of motion, Decreased strength, Decreased balance, Difficulty walking, Impaired flexibility  Visit Diagnosis: Chronic heel pain, left  Decreased strength  Other abnormalities of gait and mobility  Other symptoms and signs involving the musculoskeletal system  Impairment of balance     Problem List Patient Active Problem List   Diagnosis Date Noted  . Hyperlipidemia 03/26/2017  . Prediabetes  03/26/2017  . Anxiety 03/06/2017  . Left foot pain 03/06/2017   Verne Carrow PT, DPT 10:24 AM, 06/30/17 [phone]  [facility] [address] [place], [place], [address] Phone: [phone]   Fax:  [phone]  Name: ELLORIE KINDALL MRN: [id number] Date of Birth: [birth year]

## 2017-07-01 ENCOUNTER — Encounter: Payer: Self-pay | Admitting: Orthopaedic Surgery

## 2017-07-01 ENCOUNTER — Ambulatory Visit (INDEPENDENT_AMBULATORY_CARE_PROVIDER_SITE_OTHER): Payer: No Typology Code available for payment source | Admitting: Orthopaedic Surgery

## 2017-07-01 VITALS — BP 103/82 | Temp 98.4°F | Ht 60.0 in | Wt 151.8 lb

## 2017-07-01 DIAGNOSIS — M79672 Pain in left foot: Secondary | ICD-10-CM

## 2017-07-01 DIAGNOSIS — G8929 Other chronic pain: Secondary | ICD-10-CM

## 2017-07-01 NOTE — Progress Notes (Signed)
Patient ZO:XWRUE:Kathryn Koch, female DOB:08/01/1965, 52 y.o. AVW:[id number]  Chief Complaint  Patient presents with  . Foot Pain    left heel     HPI  Kathryn CrookKaren S Shinall is a 52 y.o. female who continues to have pain of her left heel and now the fore foot on the left.  She has been to PT.  She has had injections and medicines, all with little relief.  I am going out of town for an extended period of time.  I will have the podiatrist see her for their evaluation and treatment.  She is agreeable. HPI  Body mass index is 29.65 kg/m.  ROS  Review of Systems  Musculoskeletal: Positive for arthralgias.  All other systems reviewed and are negative.   Past Medical History:  Diagnosis Date  . Anxiety 1995    Past Surgical History:  Procedure Laterality Date  . TUBAL LIGATION      Family History  Problem Relation Age of Onset  . Hypertension Mother   . Diabetes Mother   . COPD Mother   . Kidney disease Mother   . Cirrhosis Father   . Diabetes Brother   . Hearing loss Daughter     Social History Social History   Tobacco Use  . Smoking status: Never Smoker  . Smokeless tobacco: Never Used  Substance Use Topics  . Alcohol use: No  . Drug use: No    Allergies  Allergen Reactions  . Codeine     Headache     Current Outpatient Medications  Medication Sig Dispense Refill  . citalopram (CELEXA) 20 MG tablet Take 1 tablet (20 mg total) by mouth daily. (Patient not taking: Reported on 06/18/2017) 30 tablet 0  . loratadine (CLARITIN) 10 MG tablet Take 10 mg by mouth daily as needed.     . predniSONE (STERAPRED UNI-PAK 21 TAB) 5 MG (21) TBPK tablet Take 6 pills first day; 5 pills second day; 4 pills third day; 3 pills fourth day; 2 pills next day and 1 pill last day. (Patient not taking: Reported on 06/18/2017) 21 tablet 0  . simvastatin (ZOCOR) 20 MG tablet Take 1 tablet (20 mg total) at bedtime by mouth. (Patient taking differently: Take 20 mg by mouth as needed. ) 30 tablet 4    No current facility-administered medications for this visit.      Physical Exam  Blood pressure 103/82, temperature 98.4 F (36.9 C), height 5' (1.524 m), weight 151 lb 12.8 oz (68.9 kg).  Constitutional: overall normal hygiene, normal nutrition, well developed, normal grooming, normal body habitus. Assistive device:none  Musculoskeletal: gait and station Limp left, muscle tone and strength are normal, no tremors or atrophy is present.  .  Neurological: coordination overall normal.  Deep tendon reflex/nerve stretch intact.  Sensation normal.  Cranial nerves II-XII intact.   Skin:   Normal overall no scars, lesions, ulcers or rashes. No psoriasis.  Psychiatric: Alert and oriented x 3.  Recent memory intact, remote memory unclear.  Normal mood and affect. Well groomed.  Good eye contact.  Cardiovascular: overall no swelling, no varicosities, no edema bilaterally, normal temperatures of the legs and arms, no clubbing, cyanosis and good capillary refill.  Lymphatic: palpation is normal.  She has diffuse pain of the left heel area plantar with no redness or swelling. ROM of the ankle is full.  NV is intact.  All other systems reviewed and are negative   The patient has been educated about the nature of the problem(s)  and counseled on treatment options.  The patient appeared to understand what I have discussed and is in agreement with it.  Encounter Diagnosis  Name Primary?  . Heel pain, chronic, left Yes    PLAN Call if any problems.  Precautions discussed.  Continue current medications.   Return to clinic to podiatrist.   Electronically Signed Darreld Mclean, MD 2/20/201910:19 AM

## 2017-07-02 ENCOUNTER — Telehealth (HOSPITAL_COMMUNITY): Payer: Self-pay | Admitting: Physician Assistant

## 2017-07-02 ENCOUNTER — Ambulatory Visit (HOSPITAL_COMMUNITY): Payer: Self-pay | Admitting: Physical Therapy

## 2017-07-02 NOTE — Telephone Encounter (Signed)
07/02/17  pt left a message to cx because she was sick

## 2017-07-06 ENCOUNTER — Telehealth (HOSPITAL_COMMUNITY): Payer: Self-pay | Admitting: Physical Therapy

## 2017-07-06 NOTE — Telephone Encounter (Signed)
She wants to be placed on hold, she will call us back after she sees the foot speicalist

## 2017-07-07 ENCOUNTER — Ambulatory Visit (HOSPITAL_COMMUNITY): Payer: Self-pay | Admitting: Physical Therapy

## 2017-07-09 ENCOUNTER — Encounter (HOSPITAL_COMMUNITY): Payer: Medicaid Other | Admitting: Physical Therapy

## 2017-07-13 ENCOUNTER — Ambulatory Visit: Payer: No Typology Code available for payment source

## 2017-07-13 ENCOUNTER — Encounter: Payer: Self-pay | Admitting: Podiatry

## 2017-07-13 ENCOUNTER — Ambulatory Visit: Payer: No Typology Code available for payment source | Admitting: Podiatry

## 2017-07-13 ENCOUNTER — Other Ambulatory Visit: Payer: Self-pay | Admitting: Podiatry

## 2017-07-13 DIAGNOSIS — M722 Plantar fascial fibromatosis: Secondary | ICD-10-CM

## 2017-07-13 DIAGNOSIS — R52 Pain, unspecified: Secondary | ICD-10-CM

## 2017-07-13 MED ORDER — MELOXICAM 15 MG PO TABS: 15.0000 mg | ORAL_TABLET | Freq: Every day | ORAL | 1 refills | Status: AC

## 2017-07-13 NOTE — Progress Notes (Signed)
   Subjective:    Patient ID: Kathryn CrookKaren S Koch, female    DOB: 05/05/1966, 52 y.o.   MRN: [id number]  HPI    Review of Systems  All other systems reviewed and are negative.      Objective:   Physical Exam        Assessment & Plan:

## 2017-07-15 NOTE — Progress Notes (Signed)
   Subjective: 52- year-old female presents today for pain and tenderness in the plantar aspects of bilateral heels, left worse than right, that began one month ago. She reports associated ankle pain bilaterally as well. She states that it hurts in the mornings with the first steps out of bed. She states it feels as if her ankles will "give out". She reports having an injection in the left foot 6 weeks ago which provided no significant relief. She reports taking a course of Prednisone as well. Patient presents today for further treatment and evaluation.  Past Medical History:  Diagnosis Date  . Anxiety 1995     Objective: Physical Exam General: The patient is alert and oriented x3 in no acute distress.  Dermatology: Skin is warm, dry and supple bilateral lower extremities. Negative for open lesions or macerations bilateral.   Vascular: Dorsalis Pedis and Posterior Tibial pulses palpable bilateral.  Capillary fill time is immediate to all digits.  Neurological: Epicritic and protective threshold intact bilateral.   Musculoskeletal: Tenderness to palpation to the plantar aspect of the left heel along the plantar fascia. All other joints range of motion within normal limits bilateral. Strength 5/5 in all groups bilateral.   Radiographic exam: Normal osseous mineralization. Joint spaces preserved. No fracture/dislocation/boney destruction. No other soft tissue abnormalities or radiopaque foreign bodies.   Assessment: 1. Plantar fasciitis left foot  Plan of Care:  1. Patient evaluated. Xrays reviewed.   2. Injection of 0.5cc Celestone soluspan injected into the left plantar fascia.  3. Rx for Mobic 15 mg ordered for patient. 4. Instructed patient regarding therapies and modalities at home to alleviate symptoms.  5. Return to clinic in 6 weeks.    [facility] [place] mid April 2019.    Felecia ShellingBrent M. Yvetta Drotar, DPM [facility]  Dr. Felecia ShellingBrent M. Spiros Greenfeld, DPM    [address]                                      [place], [place] [address]                Office [phone]  Fax [phone]

## 2017-07-20 ENCOUNTER — Emergency Department (HOSPITAL_COMMUNITY)
Admission: EM | Admit: 2017-07-20 | Discharge: 2017-07-20 | Disposition: A | Discharge status: Home / Self Care | Payer: Self-pay | Attending: Emergency Medicine | Admitting: Emergency Medicine

## 2017-07-20 ENCOUNTER — Encounter (HOSPITAL_COMMUNITY): Payer: Self-pay

## 2017-07-20 ENCOUNTER — Emergency Department (HOSPITAL_COMMUNITY): Payer: Self-pay

## 2017-07-20 DIAGNOSIS — F419 Anxiety disorder, unspecified: Secondary | ICD-10-CM | POA: Insufficient documentation

## 2017-07-20 DIAGNOSIS — M62830 Muscle spasm of back: Secondary | ICD-10-CM | POA: Insufficient documentation

## 2017-07-20 DIAGNOSIS — Z79899 Other long term (current) drug therapy: Secondary | ICD-10-CM | POA: Insufficient documentation

## 2017-07-20 DIAGNOSIS — M479 Spondylosis, unspecified: Secondary | ICD-10-CM | POA: Insufficient documentation

## 2017-07-20 LAB — URINALYSIS, ROUTINE W REFLEX MICROSCOPIC
Bilirubin Urine: NEGATIVE
GLUCOSE, UA: NEGATIVE mg/dL
HGB URINE DIPSTICK: NEGATIVE
KETONES UR: NEGATIVE mg/dL
Leukocytes, UA: NEGATIVE
NITRITE: POSITIVE — AB
PROTEIN: NEGATIVE mg/dL
Specific Gravity, Urine: 1.026 (ref 1.005–1.030)
pH: 6 (ref 5.0–8.0)

## 2017-07-20 MED ORDER — CYCLOBENZAPRINE HCL 10 MG PO TABS: 10.0000 mg | ORAL_TABLET | Freq: Three times a day (TID) | ORAL | 0 refills | Status: DC

## 2017-07-20 MED ORDER — DICLOFENAC SODIUM 75 MG PO TBEC: 75.0000 mg | DELAYED_RELEASE_TABLET | Freq: Two times a day (BID) | ORAL | 0 refills | Status: DC

## 2017-07-20 MED ORDER — DEXAMETHASONE 4 MG PO TABS: 4.0000 mg | ORAL_TABLET | Freq: Two times a day (BID) | ORAL | 0 refills | Status: DC

## 2017-07-20 NOTE — ED Provider Notes (Signed)
[facility]GENCY DEPARTMENT Provider Note   CSN: [id number] Arrival date & time: 07/20/17  1323     History   Chief Complaint Chief Complaint  Patient presents with  . Back Pain    HPI Kathryn Koch is a 52 y.o. female.  HPI  Past Medical History:  Diagnosis Date  . Anxiety 1995    Patient Active Problem List   Diagnosis Date Noted  . Hyperlipidemia 03/26/2017  . Prediabetes 03/26/2017  . Anxiety 03/06/2017  . Left foot pain 03/06/2017    Past Surgical History:  Procedure Laterality Date  . TUBAL LIGATION      OB History    Gravida Para Term Preterm AB Living   2         2   SAB TAB Ectopic Multiple Live Births                   Home Medications    Prior to Admission medications   Medication Sig Start Date End Date Taking? Authorizing Provider  citalopram (CELEXA) 20 MG tablet Take 1 tablet (20 mg total) by mouth daily. Patient not taking: Reported on 06/18/2017 03/05/17   Jacquelin HawkingMcElroy, Shannon, PA-C  loratadine (CLARITIN) 10 MG tablet Take 10 mg by mouth daily as needed.     [provider]  meloxicam (MOBIC) 15 MG tablet Take 1 tablet (15 mg total) by mouth daily. 07/13/17 08/12/17  Felecia ShellingEvans, Brent M, DPM  predniSONE (STERAPRED UNI-PAK 21 TAB) 5 MG (21) TBPK tablet Take 6 pills first day; 5 pills second day; 4 pills third day; 3 pills fourth day; 2 pills next day and 1 pill last day. Patient not taking: Reported on 06/18/2017 05/19/17   Darreld McleanKeeling, Wayne, MD  simvastatin (ZOCOR) 20 MG tablet Take 1 tablet (20 mg total) at bedtime by mouth. Patient not taking: Reported on 07/13/2017 03/26/17   Jacquelin HawkingMcElroy, Shannon, PA-C    Family History Family History  Problem Relation Age of Onset  . Hypertension Mother   . Diabetes Mother   . COPD Mother   . Kidney disease Mother   . Cirrhosis Father   . Diabetes Brother   . Hearing loss Daughter     Social History Social History   Tobacco Use  . Smoking status: Never Smoker  . Smokeless tobacco: Never Used    Substance Use Topics  . Alcohol use: No  . Drug use: No     Allergies   Codeine   Review of Systems Review of Systems   Physical Exam Updated Vital Signs BP 102/81 (BP Location: Left Arm)   Pulse 82   Temp 98 F (36.7 C) (Oral)   Resp 16   Ht 5' (1.524 m)   Wt 67.1 kg (148 lb)   SpO2 98%   BMI 28.90 kg/m   Physical Exam  Constitutional: She is oriented to person, place, and time. She appears well-developed and well-nourished.  Non-toxic appearance.  HENT:  Head: Normocephalic.  Right Ear: Tympanic membrane and external ear normal.  Left Ear: Tympanic membrane and external ear normal.  Eyes: EOM and lids are normal. Pupils are equal, round, and reactive to light.  Neck: Normal range of motion. Neck supple. Carotid bruit is not present.  Cardiovascular: Normal rate, regular rhythm, normal heart sounds, intact distal pulses and normal pulses.  Pulmonary/Chest: Breath sounds normal. No respiratory distress.  Abdominal: Soft. Bowel sounds are normal. There is no tenderness. There is no guarding.  Musculoskeletal: Normal range of motion.  Lumbar back: She exhibits pain and spasm.  Lymphadenopathy:       Head (right side): No submandibular adenopathy present.       Head (left side): No submandibular adenopathy present.    She has no cervical adenopathy.  Neurological: She is alert and oriented to person, place, and time. She has normal strength. No cranial nerve deficit or sensory deficit.  Skin: Skin is warm and dry.  Psychiatric: She has a normal mood and affect. Her speech is normal.  Nursing note and vitals reviewed.    ED Treatments / Results  Labs (all labs ordered are listed, but only abnormal results are displayed) Labs Reviewed  URINALYSIS, ROUTINE W REFLEX MICROSCOPIC    EKG  EKG Interpretation None       Radiology No results found.  Procedures Procedures (including critical care time)  Medications Ordered in ED Medications - No data  to display   Initial Impression / Assessment and Plan / ED Course  I have reviewed the triage vital signs and the nursing notes.  Pertinent labs & imaging results that were available during my care of the patient were reviewed by me and considered in my medical decision making (see chart for details).       Final Clinical Impressions(s) / ED Diagnoses  MDm  Vital signs within normal limits.  Pulse oximetry is 96% on room air.  Within normal limits by my interpretation. I reviewed the previous x-rays.  Patient has degenerative disease and spurring from the L2 to the L4 area.  No evidence for compression fracture.  Urinalysis shows positive nitrates, but only 0-5 red cells, and 0-5 white blood cells.  The urine will be sent to the lab for culture. X-ray of the lumbar spine shows mild disc space narrowing and osseous spurring at L2 and L3.  No gross neurologic deficits appreciated on examination at this time.  No hot areas appreciated along the spine.  I suspect the patient has a strain of the lumbar area. I have asked the patient to use a heating pad, to rest her back is much as possible.  Prescription for muscle relaxer given to the patient.  Patient is referred to Dr. Eulah Pont for additional back evaluation and management.   Final diagnoses:  Muscle spasm of back  Osteoarthritis of lower back    ED Discharge Orders        Ordered    cyclobenzaprine (FLEXERIL) 10 MG tablet  3 times daily     07/20/17 1652    dexamethasone (DECADRON) 4 MG tablet  2 times daily with meals     07/20/17 1652    diclofenac (VOLTAREN) 75 MG EC tablet  2 times daily     07/20/17 1652       Ivery Quale, PA-C 07/20/17 2050    Loren Racer, MD 07/21/17 1356

## 2017-07-20 NOTE — Discharge Instructions (Signed)
Your examination shows evidence of spasm of your lower back.  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  Please use a heating pad to your back, rest her back is much as possible.  Please use Flexeril 3 times daily for spasm pain. This medication may cause drowsiness. Please do not drink, drive, or participate in activity that requires concentration while taking this medication.  Please use diclofenac and Decadron 2 times daily with food.  Please see Dr. Eulah PontMurphy for orthopedic evaluation and management of your lower back pain and spasm.

## 2017-07-20 NOTE — ED Triage Notes (Signed)
Lower back pain all the way across at the area of my pants.  At night when I lay down it hurts up into my spine at the level of my bra strap.  This has been going on for a month.  My left elbow also hurts, it has been hurting for 3 months per pt.  No known injury.

## 2017-08-24 ENCOUNTER — Encounter: Payer: Self-pay | Admitting: Podiatry

## 2017-08-24 ENCOUNTER — Ambulatory Visit (INDEPENDENT_AMBULATORY_CARE_PROVIDER_SITE_OTHER): Payer: No Typology Code available for payment source | Admitting: Podiatry

## 2017-08-24 ENCOUNTER — Telehealth: Payer: Self-pay | Admitting: Orthopedic Surgery

## 2017-08-24 DIAGNOSIS — M722 Plantar fascial fibromatosis: Secondary | ICD-10-CM

## 2017-08-24 NOTE — Telephone Encounter (Signed)
Surgery for?  Foot: I wouldn't do that surgery

## 2017-08-24 NOTE — Telephone Encounter (Signed)
Patient walked in asking if she could see you in reference to scheduling surgery. She was seen by Dr. Hilda LiasKeeling on 07/01/2017 and he referred her to [facility] for her heel pain. She comes in stating that they told her today she would need surgery and they would not be able to get her in before her [facility] discount runs out 09/03/17. I explained to her that you did not have anything available until after you come back from your week off, which would be after her discount stops. She wanted to see if you could see her and do surgery as a emergency.  Please review Epic notes and advise

## 2017-08-25 NOTE — Telephone Encounter (Signed)
Called patient to relay. Patient returned call; voiced understanding.

## 2017-08-28 NOTE — Progress Notes (Signed)
   Subjective: 52 year old female presents today for follow up evaluation of plantar fasciitis of the left foot. She states the pain has not changed since her previous appointment. She states she has had injections in the past and they have not helped. She has been taking Mobic and Ibuprofen with no significant relief either. Patient presents today for further treatment and evaluation.  Past Medical History:  Diagnosis Date  . Anxiety 1995     Objective: Physical Exam General: The patient is alert and oriented x3 in no acute distress.  Dermatology: Skin is warm, dry and supple bilateral lower extremities. Negative for open lesions or macerations bilateral.   Vascular: Dorsalis Pedis and Posterior Tibial pulses palpable bilateral.  Capillary fill time is immediate to all digits.  Neurological: Epicritic and protective threshold intact bilateral.   Musculoskeletal: Tenderness to palpation to the plantar aspect of the left heel along the plantar fascia. All other joints range of motion within normal limits bilateral. Strength 5/5 in all groups bilateral.   Assessment: 1. Plantar fasciitis left foot - unchanged   Plan of Care:  1. Patient evaluated.  2. Recommended surgical intervention but [facility] ends this month.  3. Recommended good shoe gear.  4. Return to clinic when patient has insurance for surgical consult.     [facility] mid April 2019.    Felecia ShellingBrent M. Athanasia Stanwood, DPM [facility]  Dr. Felecia ShellingBrent M. Aerionna Moravek, DPM    [address]                                     [place], [place] [id number]                Office [phone]  Fax [phone]

## 2017-09-22 ENCOUNTER — Encounter (HOSPITAL_COMMUNITY): Payer: Self-pay | Admitting: Physical Therapy

## 2017-09-22 NOTE — Therapy (Signed)
[facility] [place], [place], [address] Phone: [phone]   Fax:  [phone]  Patient Details  Name: Kathryn Koch MRN: [id number] Date of Birth: Sep 16, 1965 Referring Provider:  No ref. provider found  Encounter Date: 09/22/2017  PHYSICAL THERAPY DISCHARGE SUMMARY  Visits from Start of Care: 3  Current functional level related to goals / functional outcomes: Unable to fully assess as patient did not return for re-assessment. See last therapy note on 06/30/17 for more detail.    Remaining deficits: Unable to fully assess as patient did not return for re-assessment. See last therapy note on 06/30/17 for more detail.    Education / Equipment: Patient was provided an initial HEP, but was not progressed as patient attended only 3 sessions.  Plan: Patient agrees to discharge.  Patient goals were not met. Patient is being discharged due to not returning since the last visit.  ?????        Clarene Critchley PT, DPT 9:53 AM, 09/22/17 [place], [place], [address] Phone: [phone]   Fax:  [phone]

## 2019-01-04 IMAGING — DX DG LUMBAR SPINE COMPLETE 4+V
5 series · 5 of 5 positions shown · non-contrast
Comparison: Plain film of the lumbar spine dated 06/09/2016.

CLINICAL DATA: Hx of chronic low back pain that has been increasing
over the past month. Patient states that it starts at the bottom of
her spine and goes up in between shoulder blades. No known injury.

EXAM:
LUMBAR SPINE - COMPLETE 4+ VIEW

[l-spine ap]
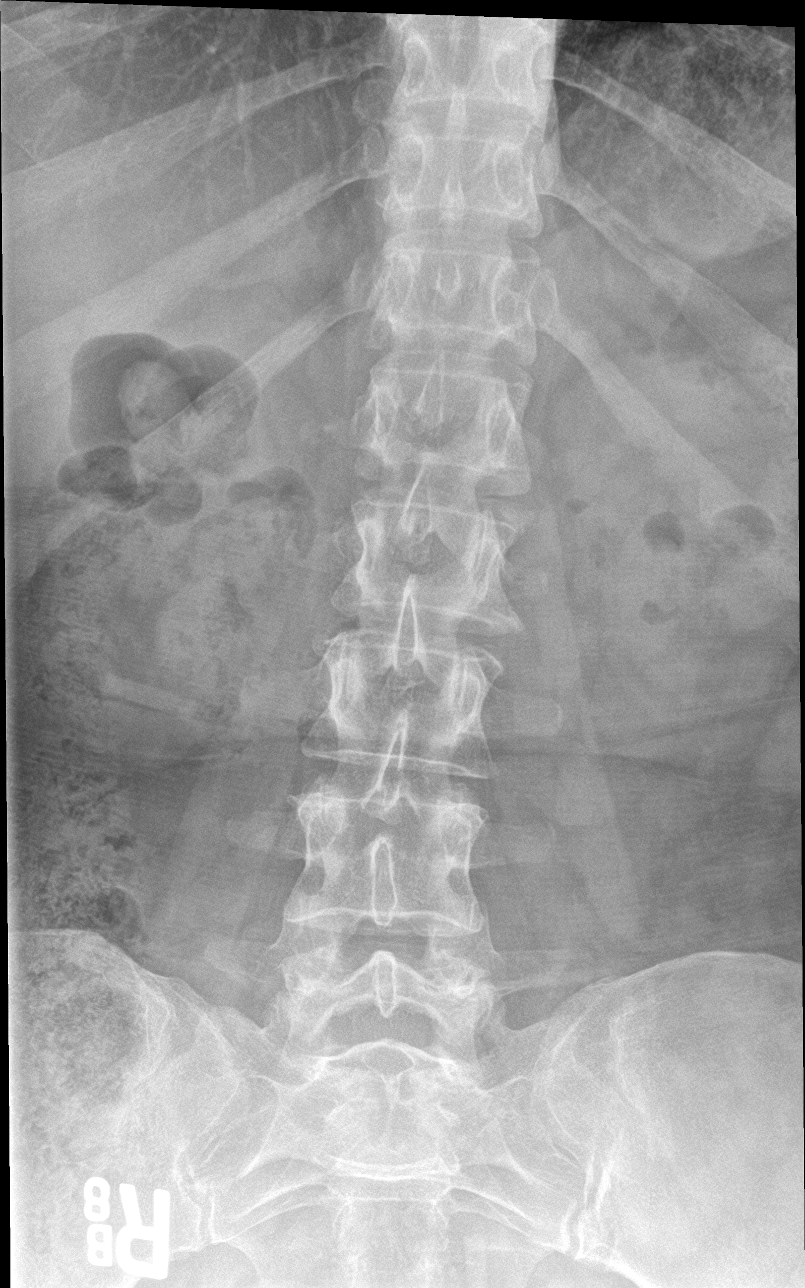

[l-spine obl (1 of 2)]
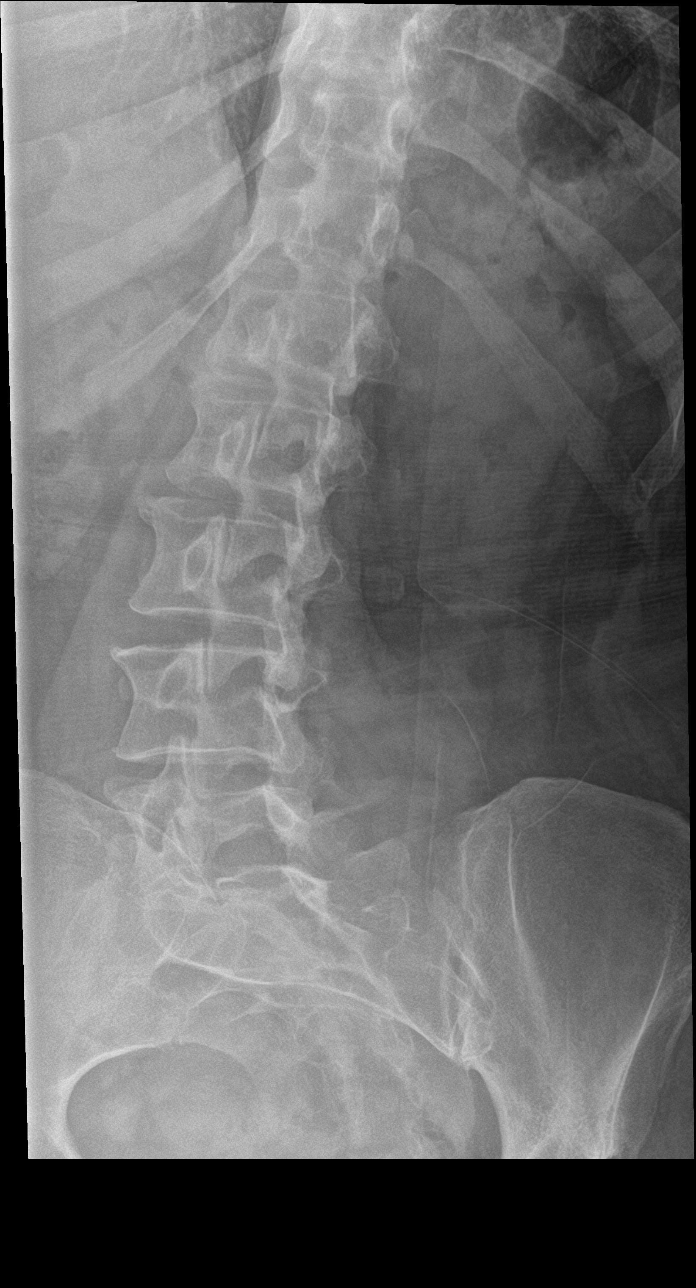

[l-spine obl (2 of 2)]
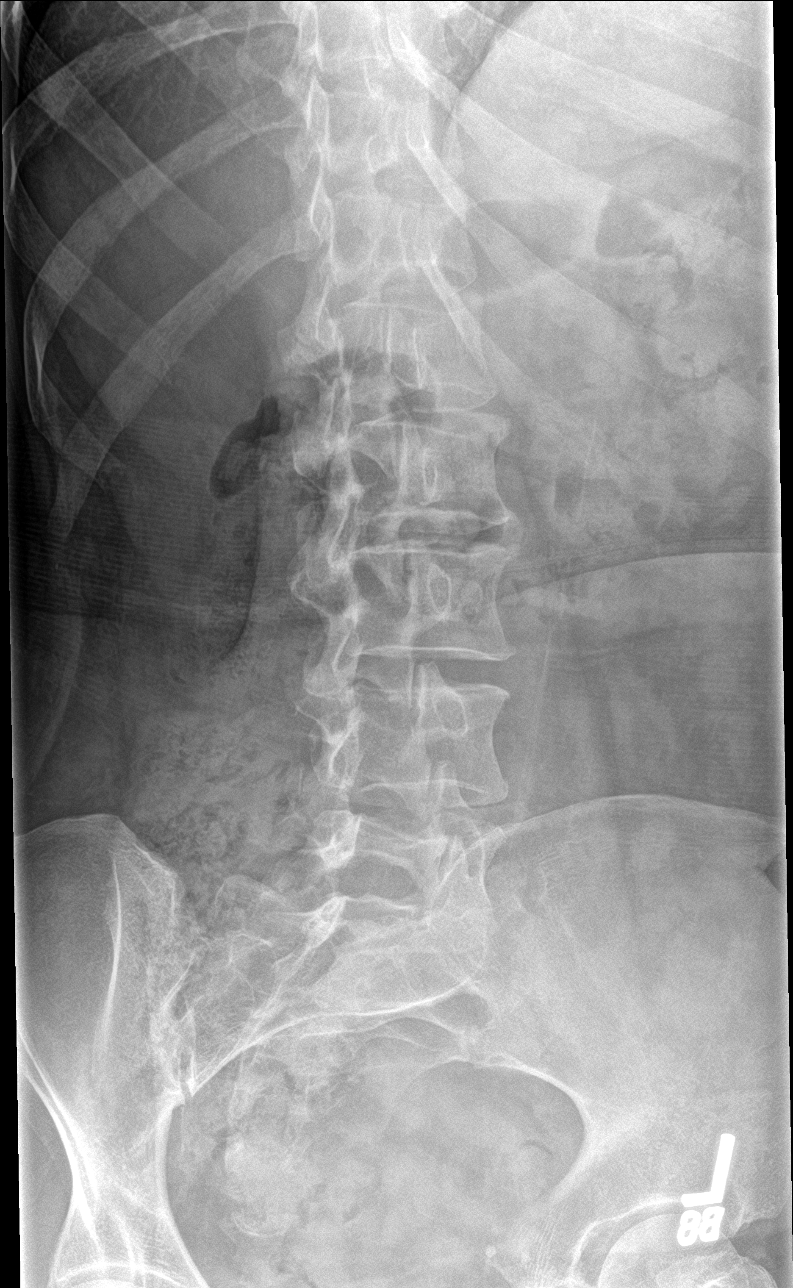

[l-spine lat]
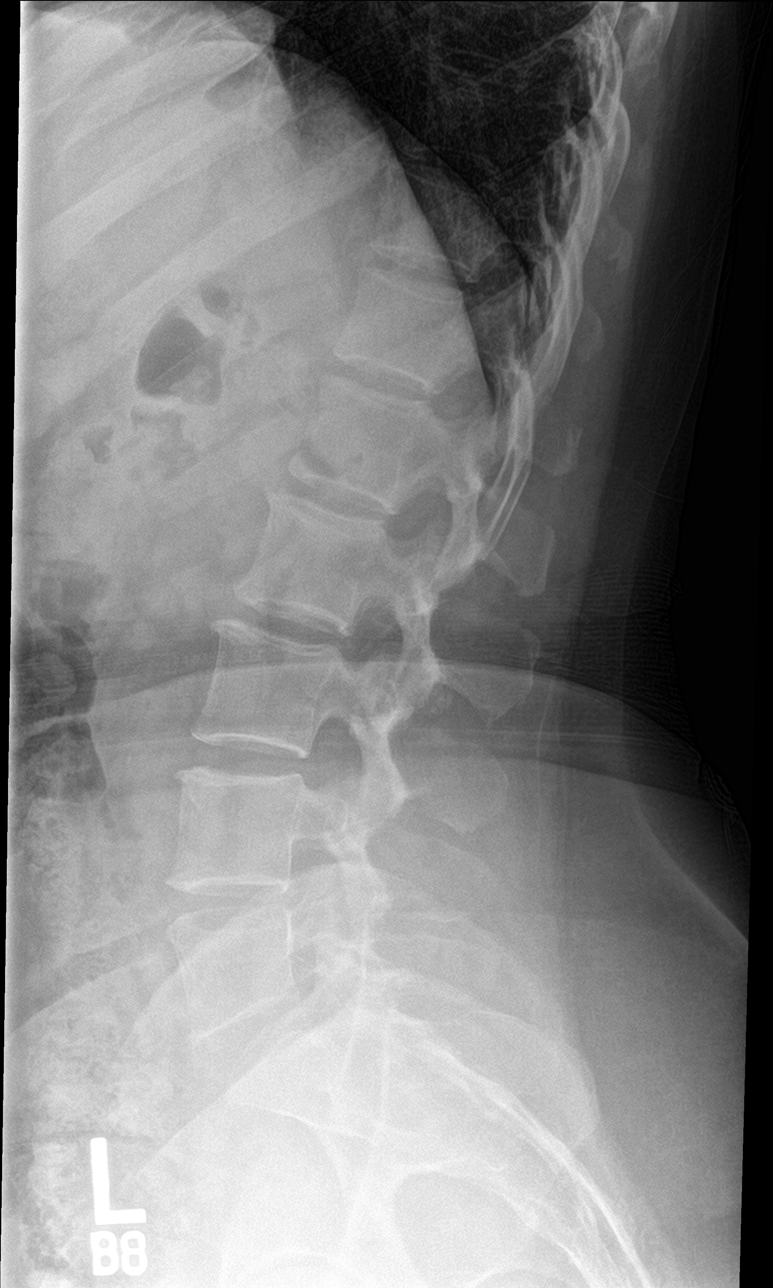

[l-spine spot]
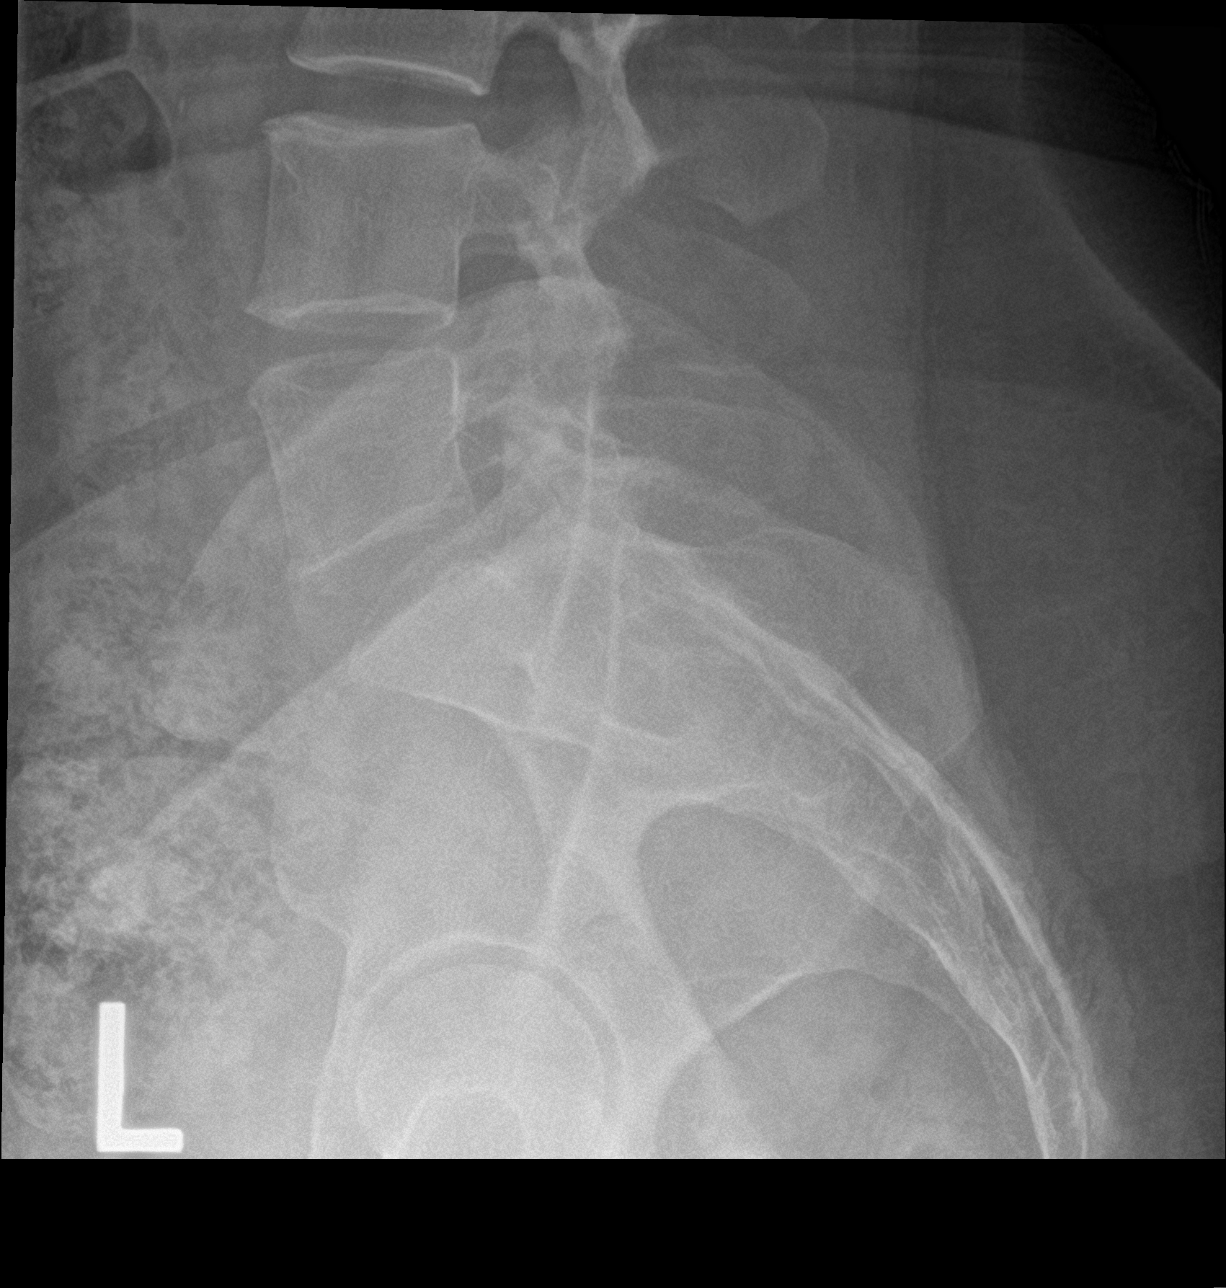

[5 of 5 positions shown; findings below may reference images not displayed]

FINDINGS: Stable mild levoscoliosis within the upper lumbar spine. Overall
alignment is stable. No acute or suspicious osseous lesion. No
fracture line or displaced fracture fragment. No evidence of pars
interarticularis defect. No significant degenerative change. Mild
disc space narrowing and osseous spurring at L2-3. Visualized
paravertebral soft tissues are unremarkable.
IMPRESSION: 1. No acute findings.
2. Mild degenerative change in the upper lumbar spine.
3. Stable mild scoliosis.

## 2023-08-06 DIAGNOSIS — H5213 Myopia, bilateral: Secondary | ICD-10-CM | POA: Diagnosis not present

## 2023-09-09 ENCOUNTER — Ambulatory Visit: Admitting: Physician Assistant

## 2023-10-13 ENCOUNTER — Ambulatory Visit: Admitting: Physician Assistant

## 2023-10-13 ENCOUNTER — Ambulatory Visit (HOSPITAL_COMMUNITY)
Admission: RE | Admit: 2023-10-13 | Discharge: 2023-10-13 | Disposition: A | Discharge status: Home / Self Care | Source: Ambulatory Visit | Attending: Physician Assistant | Admitting: Physician Assistant

## 2023-10-13 ENCOUNTER — Encounter: Payer: Self-pay | Admitting: Physician Assistant

## 2023-10-13 VITALS — BP 109/73 | HR 95 | Temp 98.2°F | Ht 60.0 in | Wt 178.0 lb

## 2023-10-13 DIAGNOSIS — M545 Low back pain, unspecified: Secondary | ICD-10-CM

## 2023-10-13 DIAGNOSIS — Z791 Long term (current) use of non-steroidal anti-inflammatories (NSAID): Secondary | ICD-10-CM | POA: Diagnosis not present

## 2023-10-13 DIAGNOSIS — R053 Chronic cough: Secondary | ICD-10-CM | POA: Insufficient documentation

## 2023-10-13 DIAGNOSIS — F411 Generalized anxiety disorder: Secondary | ICD-10-CM

## 2023-10-13 DIAGNOSIS — J069 Acute upper respiratory infection, unspecified: Secondary | ICD-10-CM | POA: Insufficient documentation

## 2023-10-13 DIAGNOSIS — Z7689 Persons encountering health services in other specified circumstances: Secondary | ICD-10-CM

## 2023-10-13 DIAGNOSIS — F321 Major depressive disorder, single episode, moderate: Secondary | ICD-10-CM | POA: Insufficient documentation

## 2023-10-13 DIAGNOSIS — G8929 Other chronic pain: Secondary | ICD-10-CM | POA: Diagnosis not present

## 2023-10-13 DIAGNOSIS — M47816 Spondylosis without myelopathy or radiculopathy, lumbar region: Secondary | ICD-10-CM | POA: Diagnosis not present

## 2023-10-13 MED ORDER — CITALOPRAM HYDROBROMIDE 20 MG PO TABS: 20.0000 mg | ORAL_TABLET | Freq: Every day | ORAL | 0 refills | Status: DC

## 2023-10-13 MED ORDER — CYCLOBENZAPRINE HCL 10 MG PO TABS: 10.0000 mg | ORAL_TABLET | Freq: Three times a day (TID) | ORAL | 0 refills | Status: DC

## 2023-10-13 MED ORDER — CITALOPRAM HYDROBROMIDE 20 MG PO TABS: 20.0000 mg | ORAL_TABLET | Freq: Every day | ORAL | 1 refills | Status: DC

## 2023-10-13 MED ORDER — NAPROXEN 500 MG PO TABS: 500.0000 mg | ORAL_TABLET | Freq: Two times a day (BID) | ORAL | 1 refills | Status: DC

## 2023-10-13 NOTE — Assessment & Plan Note (Signed)
 Patient presents today with long history of anxiety, worsened recently by family tragedy. Will restart Celexa . Patient counseled on side effects such as nausea and headache, she was advised to stop taking medication if she experiences suicidal ideation. Will discuss adding medications as necessary based on her response to Celexa  in 4-6 weeks. Counseled on importance of daily compliance.
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
## 2023-10-13 NOTE — Assessment & Plan Note (Signed)
 Patient presents today with chronic cough daily for more than 3 years. Lungs clear to auscultation bilaterally, no signs of respiratory distress. Chest XR for further investigation. Will consider referral to pulmonology based on XR vs symptoms at patient's follow up appointment.

## 2023-10-13 NOTE — Assessment & Plan Note (Signed)
 Patient presents today with depression following family tragedy. She denies self harm. Will restart Celexa  today. Patient counseled on side effects such as nausea and headache, she was advised to stop taking medication if she experiences suicidal ideation. She should follow up in 4-6 weeks. Advised daily medication compliance.

## 2023-10-13 NOTE — Assessment & Plan Note (Signed)
 Patient appears stable today. Benign rather exam, lungs clear to auscultation bilaterally. Likely self-resolving viral infection. Supportive care reviewed with patient. Discussed with patient that there are no indications for antibiotics at this time, and viral respiratory illness can be persistent in duration.Tylenol  for pain or fever as needed. I advised Flonase and allergy medication like Zyrtec for nasal congestion. May try Mucinex for sputum production. Patient instructed to return to clinic if worsening shortness of breath, chest pain, hypoxia, or other concerns. Patient agreeable to plan.
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
## 2023-10-14 ENCOUNTER — Ambulatory Visit: Payer: Self-pay | Admitting: Physician Assistant

## 2023-10-14 LAB — CMP14+EGFR
ALT: 19 IU/L (ref 0–32)
AST: 30 IU/L (ref 0–40)
Albumin: 4.4 g/dL (ref 3.8–4.9)
Alkaline Phosphatase: 104 IU/L (ref 44–121)
BUN/Creatinine Ratio: 13 (ref 12–28)
BUN: 13 mg/dL (ref 8–27)
Bilirubin Total: 0.5 mg/dL (ref 0.0–1.2)
CO2: 20 mmol/L (ref 20–29)
Calcium: 9.3 mg/dL (ref 8.7–10.3)
Chloride: 103 mmol/L (ref 96–106)
Creatinine, Ser: 0.99 mg/dL (ref 0.57–1.00)
Globulin, Total: 2.6 g/dL (ref 1.5–4.5)
Glucose: 123 mg/dL — ABNORMAL HIGH (ref 70–99)
Potassium: 4.6 mmol/L (ref 3.5–5.2)
Sodium: 140 mmol/L (ref 134–144)
Total Protein: 7 g/dL (ref 6.0–8.5)
eGFR: 65 mL/min/{1.73_m2} (ref >59)

## 2023-10-16 ENCOUNTER — Ambulatory Visit: Admitting: Physician Assistant

## 2023-11-03 ENCOUNTER — Ambulatory Visit: Admitting: Physician Assistant

## 2023-11-12 ENCOUNTER — Ambulatory Visit: Admitting: Physician Assistant

## 2023-11-12 ENCOUNTER — Telehealth: Payer: Self-pay | Admitting: Pharmacy Technician

## 2023-11-12 ENCOUNTER — Other Ambulatory Visit (HOSPITAL_COMMUNITY): Payer: Self-pay

## 2023-11-12 ENCOUNTER — Other Ambulatory Visit: Payer: Self-pay | Admitting: Physician Assistant

## 2023-11-12 ENCOUNTER — Encounter: Payer: Self-pay | Admitting: Physician Assistant

## 2023-11-12 VITALS — BP 104/72 | HR 82 | Temp 98.1°F | Ht 60.0 in | Wt 177.0 lb

## 2023-11-12 DIAGNOSIS — R053 Chronic cough: Secondary | ICD-10-CM

## 2023-11-12 DIAGNOSIS — Z1211 Encounter for screening for malignant neoplasm of colon: Secondary | ICD-10-CM

## 2023-11-12 DIAGNOSIS — F321 Major depressive disorder, single episode, moderate: Secondary | ICD-10-CM

## 2023-11-12 DIAGNOSIS — Z1231 Encounter for screening mammogram for malignant neoplasm of breast: Secondary | ICD-10-CM

## 2023-11-12 DIAGNOSIS — M545 Low back pain, unspecified: Secondary | ICD-10-CM

## 2023-11-12 MED ORDER — ARIPIPRAZOLE 5 MG PO TABS: 5.0000 mg | ORAL_TABLET | Freq: Every day | ORAL | 1 refills | Status: DC

## 2023-11-12 NOTE — Progress Notes (Signed)
 Established Patient Office Visit  Subjective   Patient ID: Kathryn Koch, female    DOB: 11-26-1965  Age: 58 y.o. MRN: [id number]  No chief complaint on file.   Patient presents today for follow up regarding depression, anxiety, and chronic cough. She reports improved symptoms with Celexa , however relates persistent symptoms despite treatment. She denies SI or self harm. She endorses a mix of anxiety and depression. She voices interest in increasing or adding medication. Additionally, she reports continued cough since previous visit. At that time she reported chronic cough since 2022. Last visit patient treated for viral URI, however cough is still present today. She relates cough to covid vaccination in 2022. She denies chest pain or shortness of breath.      Review of Systems  Constitutional:  Negative for chills, fever and malaise/fatigue.  Respiratory:  Positive for cough. Negative for sputum production and shortness of breath.   Cardiovascular:  Negative for chest pain and palpitations.  Psychiatric/Behavioral:  Positive for depression. Negative for suicidal ideas. The patient is nervous/anxious.       Objective:     BP 104/72   Pulse 82   Temp 98.1 F (36.7 C)   Ht 5' (1.524 m)   Wt 177 lb (80.3 kg)   SpO2 98%   BMI 34.57 kg/m    Physical Exam Constitutional:      Appearance: Normal appearance.  HENT:     Head: Normocephalic.     Mouth/Throat:     Mouth: Mucous membranes are moist.     Pharynx: Oropharynx is clear.  Eyes:     Extraocular Movements: Extraocular movements intact.     Conjunctiva/sclera: Conjunctivae normal.  Cardiovascular:     Rate and Rhythm: Normal rate and regular rhythm.     Heart sounds: Normal heart sounds. No murmur heard. Pulmonary:     Effort: Pulmonary effort is normal.     Breath sounds: Normal breath sounds. No wheezing, rhonchi or rales.  Musculoskeletal:     Right lower leg: No edema.     Left lower leg: No edema.  Skin:     General: Skin is warm and dry.  Neurological:     General: No focal deficit present.     Mental Status: She is alert and oriented to person, place, and time.  Psychiatric:        Mood and Affect: Mood normal.        Behavior: Behavior normal.      No results found for any visits on 11/12/23.  The ASCVD Risk score (Arnett DK, et al., 2019) failed to calculate for the following reasons:   Cannot find a previous HDL lab   Cannot find a previous total cholesterol lab    Assessment & Plan:   Return in about 6 weeks (around 12/24/2023).   Chronic cough Assessment & Plan: Patient presents today with chronic cough. Overall normal physical exam, lungs clear to auscultation bilaterally. Normal chest XR last visit. Cough has been present for more than 2 years. Patient has never smoked. Referral to pulmonology for further workup and evaluation.   Orders: -     Pulmonary Visit  Depression, major, single episode, moderate (HCC) Assessment & Plan: Symptoms improving with Celexa , although still persistent in nature. 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 Patient counseled on side effects such as nausea and headache, she was advised to stop taking medication if she experiences suicidal ideation. Patient to follow up in 6 weeks.  Orders: -     ARIPiprazole; Take 1 tablet (5 mg total) by mouth daily.  Dispense: 30 tablet; Refill: 1  Screen for colon cancer -     Cologuard  Encounter for screening mammogram for malignant neoplasm of breast -     3D Screening Mammogram, Left and Right    Izayiah Tibbitts, PA-C

## 2023-11-12 NOTE — Telephone Encounter (Signed)
 Pharmacy Patient Advocate Encounter  Received notification from [facility] that Prior Authorization for ARIPiprazole 5MG  tablets has been APPROVED from 11/12/2023 to 11/11/2024. Ran test claim, Copay is $4.00. This test claim was processed through [facility]- copay amounts may vary at other pharmacies due to pharmacy/plan contracts, or as the patient moves through the different stages of their insurance plan.   PA #/Case ID/Reference #: [id number]

## 2023-11-12 NOTE — Telephone Encounter (Signed)
 Pharmacy Patient Advocate Encounter   Received notification from CoverMyMeds that prior authorization for ARIPiprazole 5MG  tablets is required/requested.   Insurance verification completed.   The patient is insured through [facility] .   Per test claim: PA required; PA submitted to above mentioned insurance via CoverMyMeds Key/confirmation #/EOC [facility] Status is pending

## 2023-11-12 NOTE — Assessment & Plan Note (Signed)
 Patient presents today with chronic cough. Overall normal physical exam, lungs clear to auscultation bilaterally. Normal chest XR last visit. Cough has been present for more than 2 years. Patient has never smoked. Referral to pulmonology for further workup and evaluation.

## 2023-11-12 NOTE — Assessment & Plan Note (Signed)
 Symptoms improving with Celexa , although still persistent in nature. 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 Patient counseled on side effects such as nausea and headache, she was advised to stop taking medication if she experiences suicidal ideation. Patient to follow up in 6 weeks.

## 2023-11-24 ENCOUNTER — Ambulatory Visit: Admitting: Physician Assistant

## 2023-12-23 ENCOUNTER — Ambulatory Visit: Admitting: Physician Assistant

## 2023-12-23 VITALS — BP 107/74 | HR 88 | Temp 97.9°F | Ht 60.0 in | Wt 180.0 lb

## 2023-12-23 DIAGNOSIS — F411 Generalized anxiety disorder: Secondary | ICD-10-CM | POA: Diagnosis not present

## 2023-12-23 DIAGNOSIS — G8929 Other chronic pain: Secondary | ICD-10-CM | POA: Diagnosis not present

## 2023-12-23 DIAGNOSIS — M545 Low back pain, unspecified: Secondary | ICD-10-CM

## 2023-12-23 MED ORDER — HYDROXYZINE PAMOATE 25 MG PO CAPS: 25.0000 mg | ORAL_CAPSULE | Freq: Three times a day (TID) | ORAL | 1 refills | Status: DC | PRN

## 2023-12-23 MED ORDER — ARIPIPRAZOLE 10 MG PO TABS: 10.0000 mg | ORAL_TABLET | Freq: Every day | ORAL | 1 refills | Status: DC

## 2023-12-23 NOTE — Progress Notes (Signed)
 Established Patient Office Visit  Subjective   Patient ID: Kathryn Koch, female    DOB: Feb 04, 1966  Age: 58 y.o. MRN: [id number]  No chief complaint on file.   Patient presents today to follow up regarding depression and anxiety. She reports overall improved symptoms, however continue to endorse anxiety, specifically at night. She relates getting only a few hours of sleep secondary to increased anxiety and racing thoughts. She denies adverse effects from recently prescribed Abilify . Additionally, she reports generalized back pain and left arm pain. Previous x-rays significant for degenerative changes. She denies SI.       Review of Systems  Constitutional:  Negative for fever, malaise/fatigue and weight loss.  Respiratory:  Negative for cough and shortness of breath.   Cardiovascular:  Negative for chest pain and palpitations.  Musculoskeletal:  Positive for back pain and joint pain. Negative for myalgias and neck pain.  Neurological:  Positive for tingling. Negative for dizziness and headaches.  Psychiatric/Behavioral:  Positive for depression. Negative for suicidal ideas. The patient is nervous/anxious and has insomnia.       Objective:     BP 107/74   Pulse 88   Temp 97.9 F (36.6 C)   Ht 5' (1.524 m)   Wt 180 lb (81.6 kg)   SpO2 96%   BMI 35.15 kg/m    Physical Exam Constitutional:      Appearance: Normal appearance. She is obese.  HENT:     Head: Normocephalic.     Mouth/Throat:     Mouth: Mucous membranes are moist.     Pharynx: Oropharynx is clear.  Eyes:     Extraocular Movements: Extraocular movements intact.     Conjunctiva/sclera: Conjunctivae normal.  Cardiovascular:     Rate and Rhythm: Normal rate and regular rhythm.     Heart sounds: Normal heart sounds. No murmur heard. Pulmonary:     Effort: Pulmonary effort is normal.     Breath sounds: Normal breath sounds. No wheezing or rales.  Musculoskeletal:     Cervical back: No spasms, tenderness or  bony tenderness. Normal range of motion.     Thoracic back: No spasms, tenderness or bony tenderness. Normal range of motion.     Lumbar back: No spasms, tenderness or bony tenderness.  Skin:    General: Skin is warm and dry.  Neurological:     General: No focal deficit present.     Mental Status: She is alert and oriented to person, place, and time.  Psychiatric:        Mood and Affect: Mood normal.        Behavior: Behavior normal.     No results found for any visits on 12/23/23.  The ASCVD Risk score (Arnett DK, et al., 2019) failed to calculate for the following reasons:   Cannot find a previous HDL lab   Cannot find a previous total cholesterol lab    Assessment & Plan:   Return in about 6 weeks (around 02/03/2024).   Generalized anxiety disorder Assessment & Plan: Patient improving with Celexa  and Abilify . Increase Abilify  to 10 mg daily. Adding hydroxyzine  as needed for increased anxiety, specifically before bed. Patient interested in referral to therapy. Patient counseled on side effects of medication such as nausea and headache, she was advised to stop taking medication if she experiences suicidal ideation. Patient to follow up in 6 weeks.   Orders: -     Ambulatory referral to Psychology -     ARIPiprazole ; Take 1  tablet (10 mg total) by mouth daily.  Dispense: 90 tablet; Refill: 1 -     hydrOXYzine  Pamoate; Take 1 capsule (25 mg total) by mouth every 8 (eight) hours as needed.  Dispense: 30 capsule; Refill: 1  Chronic bilateral low back pain without sciatica Assessment & Plan: Patient presents today with complaints of  generalized back pain. Benign exam. Continue naproxen  and flexeril  as needed for pain. Referral to physical therapy and orthopedics for further evaluation and management. Back stretches and strengthening handout given today.   Orders: -     Ambulatory referral to Physical Therapy -     Ambulatory referral to Orthopedics    [place] Shigeru Lampert, PA-C

## 2023-12-24 ENCOUNTER — Encounter: Payer: Self-pay | Admitting: Physician Assistant

## 2023-12-24 ENCOUNTER — Ambulatory Visit: Admitting: Physician Assistant

## 2023-12-24 NOTE — Assessment & Plan Note (Signed)
 Patient improving with Celexa  and Abilify . Increase Abilify  to 10 mg daily. Adding hydroxyzine  as needed for increased anxiety, specifically before bed. Patient interested in referral to therapy. Patient counseled on side effects of medication such as nausea and headache, she was advised to stop taking medication if she experiences suicidal ideation. Patient to follow up in 6 weeks.

## 2023-12-24 NOTE — Assessment & Plan Note (Signed)
 Patient presents today with complaints of  generalized back pain. Benign exam. Continue naproxen  and flexeril  as needed for pain. Referral to physical therapy and orthopedics for further evaluation and management. Back stretches and strengthening handout given today.

## 2023-12-28 ENCOUNTER — Ambulatory Visit: Admitting: Internal Medicine

## 2024-02-02 ENCOUNTER — Ambulatory Visit: Admitting: Internal Medicine

## 2024-02-02 ENCOUNTER — Encounter: Payer: Self-pay | Admitting: Internal Medicine

## 2024-02-02 VITALS — BP 116/76 | HR 100 | Ht 60.0 in | Wt 181.2 lb

## 2024-02-02 DIAGNOSIS — R053 Chronic cough: Secondary | ICD-10-CM

## 2024-02-02 MED ORDER — PREDNISONE 10 MG PO TABS: ORAL_TABLET | ORAL | 0 refills | Status: DC

## 2024-02-02 MED ORDER — FAMOTIDINE 20 MG PO TABS: ORAL_TABLET | ORAL | 11 refills | Status: AC

## 2024-02-02 MED ORDER — HYDROCODONE BIT-HOMATROP MBR 5-1.5 MG/5ML PO SOLN: 5.0000 mL | ORAL | 0 refills | Status: AC | PRN

## 2024-02-02 MED ORDER — PANTOPRAZOLE SODIUM 40 MG PO TBEC: 40.0000 mg | DELAYED_RELEASE_TABLET | Freq: Every day | ORAL | 2 refills | Status: DC

## 2024-02-02 NOTE — Patient Instructions (Addendum)
 The key to effective treatment for your cough is eliminating the non-stop cycle of cough you're stuck in long enough to let your airway heal completely and then see if there is anything still making you cough once you stop the cough suppression, but this should take no more than 5 days to figure out   Hycodan cough syrup to 100% suppress your cough x at least 5 dasy   Prednisone  10 mg take  4 each am x 2 days,   2 each am x 2 days,  1 each am x 2 days and stop (this is to eliminate allergies and inflammation from coughing)  Protonix  (pantoprazole ) Take 30-60 min before first meal of the day and Pepcid  20 mg one bedtime plus chlorpheniramine 4 mg x  one or two at bedtime ( available over the counter)  until cough is completely gone for at least a week without the need for cough suppression  GERD (REFLUX)  is an extremely common cause of respiratory symptoms, many times with no significant heartburn at all.    It can be treated with medication, but also with lifestyle changes including avoidance of late meals, excessive alcohol, smoking cessation, and avoid fatty foods, chocolate, peppermint, colas, red wine, and acidic juices such as orange juice.  NO MINT OR MENTHOL PRODUCTS SO NO COUGH DROPS  USE HARD CANDY INSTEAD (jolley ranchers or Stover's or Lifesavers (all available in sugarless versions) NO OIL BASED VITAMINS - use powdered substitutes.  Please schedule a follow up office visit in 4 weeks, sooner if needed

## 2024-02-02 NOTE — Progress Notes (Signed)
 Kathryn Koch, female    DOB: Jun 21, 1965    MRN: [id number]   Brief patient profile:  58   yowf   never smoker [place]   born x raised x 20 years then moved to  [facility] where developed spring time allergy(scratchy thoat, watery eyes better with zyrtec)  2005 moved into present residence 2011  referred to pulmonary clinic in [place]  02/02/2024 by Charmaine Grooms PA  for refractory cough p covid shot March 2022 and covid a year later with loss of smell.    Pt not previously seen by PCCM service.    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  Pt c/o cough since March 2022. Cough is non prod and worse when sh goes out in public.  She has occ SOB while having a coughing spell.   Dyspnea:  worse p husband's death/ does not go out or do steps  Cough: 24/7 dry unless bloody every few months clots of blood x sev days then stops / severe to point of presyncope/ vomiting and urinary incont  Sleep: hob x 30 degrees  SABA use: none  02: none  Using lots of mints     No obvious day to day or daytime pattern/variability or assoc excess/ purulent sputum or mucus plugs  or cp or chest tightness, subjective wheeze or overt sinus or hb symptoms.    Also denies any obvious fluctuation of symptoms with weather or environmental changes or other aggravating or alleviating factors except as outlined above   No unusual exposure hx or h/o childhood pna/ asthma or knowledge of premature birth.  Current Allergies, Complete Past Medical History, Past Surgical History, Family History, and Social History were reviewed in [employer] record.  ROS  The following are not active complaints unless bolded Hoarseness, sore throat, dysphagia, dental problems,  itching, sneezing,  nasal congestion or discharge of excess mucus or purulent secretions, ear ache,   fever, chills, sweats, unintended wt loss or wt gain, classically pleuritic or exertional cp,  orthopnea pnd or arm/hand swelling  or leg swelling, presyncope, palpitations, abdominal pain, anorexia, nausea, vomiting, diarrhea  or change in bowel habits or change in bladder habits, change in stools or change in urine, dysuria, hematuria,  rash, arthralgias, visual complaints, headache, numbness, weakness or ataxia or problems with walking or coordination,  change in mood or  memory.            Outpatient Medications Prior to Visit  Medication Sig Dispense Refill   ARIPiprazole  (ABILIFY ) 5 MG tablet Take 5 mg by mouth daily.     citalopram  (CELEXA ) 20 MG tablet Take 1 tablet (20 mg total) by mouth daily. 30 tablet 1   cyclobenzaprine  (FLEXERIL ) 10 MG tablet TAKE 1 TABLET BY MOUTH THREE TIMES DAILY 30 tablet 0   hydrOXYzine  (VISTARIL ) 25 MG capsule Take 1 capsule (25 mg total) by mouth every 8 (eight) hours as needed. 30 capsule 1   LORazepam (ATIVAN) 1 MG tablet Take 1 mg by mouth daily.     meloxicam  (MOBIC ) 15 MG tablet Take 15 mg by mouth daily.     naproxen  (NAPROSYN ) 500 MG tablet Take 1 tablet (500 mg total) by mouth 2 (two)  times daily with a meal. 60 tablet 1   ARIPiprazole  (ABILIFY ) 10 MG tablet Take 1 tablet (10 mg total) by mouth daily. 90 tablet 1   No facility-administered medications prior to visit.    Past Medical History:  Diagnosis Date   Anxiety 1995   Depression       Objective:     BP 116/76   Pulse 100   Ht 5' (1.524 m)   Wt 181 lb 3.2 oz (82.2 kg)   SpO2 98% Comment: on RA  BMI 35.39 kg/m   SpO2: 98 % (on RA) amb mod obese (by BMI)    HEENT : Oropharynx  clear      Nasal turbinates swollen with watery secretions   NECK :  without  apparent JVD/ palpable Nodes/TM    LUNGS: no acc muscle use,  Nl contour chest which is clear to A and P bilaterally  without cough on insp or exp maneuvers   CV:  RRR  no s3 or murmur or increase in P2, and no edema   ABD:  soft and nontender   MS:  Gait nl   ext warm without deformities Or obvious joint restrictions  calf tenderness, cyanosis or clubbing    SKIN: warm and dry without lesions    NEURO:  alert, approp, nl sensorium with  no motor or cerebellar deficits apparent.     I personally reviewed images and agree with radiology impression as follows:  CXR:   pa and lateral 6//3/25  Wnl    Assessment   Assessment & Plan Chronic cough Never smoker/ mild seasonal allergies since 2005 Onset March 2022 p covid vaccination  - Allergy screen 02/02/2024 >  Eos 0. /  IgE   - cyclical cough rx  02/02/2024  hycodan / Prednisone  10 mg take  4 each am x 2 days,   2 each am x 2 days,  1 each am x 2 days and stop and max gerd rx   Most likely this is Upper airway cough syndrome (previously labeled PNDS),  is so named because it's frequently impossible to sort out how much is  CR/sinusitis with freq throat clearing (which can be related to primary GERD)   vs  causing  secondary ( extra esophageal)  GERD from wide swings in gastric pressure that occur with throat clearing, often  promoting self use of mint and menthol lozenges that reduce the lower esophageal sphincter tone and exacerbate the problem further in a cyclical fashion.   These are the same pts (now being labeled as having irritable larynx syndrome by some cough centers) who not infrequently have a history of having failed to tolerate ace inhibitors,  dry powder inhalers or biphosphonates or report having atypical/extraesophageal reflux symptoms from LPR (globus, throat clearing)  that don't respond to standard doses of PPI  and are easily confused as having aecopd or asthma flares by even experienced allergists/ pulmonologists (myself included).   Of the three most common causes of  Sub-acute / recurrent or chronic cough, only one (GERD)  can  actually contribute to/ trigger  the other two (asthma and post nasal drip syndrome)  and perpetuate the cylce of cough.  While not intuitively obvious, many patients with chronic low grade reflux do not cough until there is a primary insult that disturbs the protective epithelial barrier and exposes sensitive nerve endings.   This is typically viral but can due to PNDS and  either may apply here.     >>>  The point is that once this occurs, it is difficult to eliminate the cycle  using anything but a maximally effective acid suppression regimen at least in the short run, accompanied by an appropriate diet to address non acid GERD and control / eliminate the cough itself for at least 5 days with hycodan, eliminate pnds with 1st gen H1 blockers per guidelines  >>> also added 6 day taper off  Prednisone  starting at 40 mg per day in case of component of Th-2 driven upper or lower airways inflammation (if cough responds short term only to relapse before return while will on full rx for uacs (as above), then that would point to allergic rhinitis/ asthma or eos bronchitis as alternative dx)    F/u 4 weeks, call sooner if needed          Each maintenance medication was reviewed in detail including emphasizing most importantly the difference between maintenance and prns and under what circumstances the prns are to be triggered using an action plan format where appropriate.  Total time for H and P, chart review, counseling,   and generating customized AVS unique to this office visit / same day charting = 50 min new pt eval          AVS  Patient Instructions  The key to effective treatment for your cough is eliminating the non-stop cycle of cough you're stuck in long enough to let your airway heal completely and then see if there is anything still making you cough once you stop the cough suppression, but this should take no more than 5 days to figure out   Hycodan cough syrup to 100% suppress your cough x  at least 5 dasy   Prednisone  10 mg take  4 each am x 2 days,   2 each am x 2 days,  1 each am x 2 days and stop (this is to eliminate allergies and inflammation from coughing)  Protonix  (pantoprazole ) Take 30-60 min before first meal of the day and Pepcid  20 mg one bedtime plus chlorpheniramine 4 mg x  one or two at bedtime ( available over the counter)  until cough is completely gone for at least a week without the need for cough suppression  GERD (REFLUX)  is an extremely common cause of respiratory symptoms, many times with no significant heartburn at all.    It can be treated with medication, but also with lifestyle changes including avoidance of late meals, excessive alcohol, smoking cessation, and avoid fatty foods, chocolate, peppermint, colas, red wine, and acidic juices such as orange juice.  NO MINT OR MENTHOL PRODUCTS SO NO COUGH DROPS  USE HARD CANDY INSTEAD (jolley ranchers or Stover's or Lifesavers (all available in sugarless versions) NO OIL BASED VITAMINS - use powdered substitutes.  Please schedule a follow up office visit in 4 weeks, sooner if needed                 Ozell America, MD 02/02/2024

## 2024-02-02 NOTE — Assessment & Plan Note (Addendum)
 Never smoker/ mild seasonal allergies since 2005 Onset March 2022 p covid vaccination  - Allergy screen 02/02/2024 >  Eos 0. /  IgE   - cyclical cough rx  02/02/2024  hycodan / Prednisone  10 mg take  4 each am x 2 days,   2 each am x 2 days,  1 each am x 2 days and stop and max gerd rx   Most likely this is Upper airway cough syndrome (previously labeled PNDS),  is so named because it's frequently impossible to sort out how much is  CR/sinusitis with freq throat clearing (which can be related to primary GERD)   vs  causing  secondary ( extra esophageal)  GERD from wide swings in gastric pressure that occur with throat clearing, often  promoting self use of mint and menthol lozenges that reduce the lower esophageal sphincter tone and exacerbate the problem further in a cyclical fashion.   These are the same pts (now being labeled as having irritable larynx syndrome by some cough centers) who not infrequently have a history of having failed to tolerate ace inhibitors,  dry powder inhalers or biphosphonates or report having atypical/extraesophageal reflux symptoms from LPR (globus, throat clearing)  that don't respond to standard doses of PPI  and are easily confused as having aecopd or asthma flares by even experienced allergists/ pulmonologists (myself included).   Of the three most common causes of  Sub-acute / recurrent or chronic cough, only one (GERD)  can actually contribute to/ trigger  the other two (asthma and post nasal drip syndrome)  and perpetuate the cylce of cough.  While not intuitively obvious, many patients with chronic low grade reflux do not cough until there is a primary insult that disturbs the protective epithelial barrier and exposes sensitive nerve endings.   This is typically viral but can due to PNDS and  either may apply here.     >>>The point is that once this occurs, it is difficult to eliminate the cycle  using anything but a maximally effective acid suppression regimen  at least in the short run, accompanied by an appropriate diet to address non acid GERD and control / eliminate the cough itself for at least 5 days with hycodan, eliminate pnds with 1st gen H1 blockers per guidelines  >>> also added 6 day taper off  Prednisone  starting at 40 mg per day in case of component of Th-2 driven upper or lower airways inflammation (if cough responds short term only to relapse before return while will on full rx for uacs (as above), then that would point to allergic rhinitis/ asthma or eos bronchitis as alternative dx)    F/u 4 weeks, call sooner if needed          Each maintenance medication was reviewed in detail including emphasizing most importantly the difference between maintenance and prns and under what circumstances the prns are to be triggered using an action plan format where appropriate.  Total time for H and P, chart review, counseling,   and generating customized AVS unique to this office visit / same day charting = 50 min new pt eval

## 2024-02-03 ENCOUNTER — Ambulatory Visit: Admitting: Physician Assistant

## 2024-02-05 ENCOUNTER — Ambulatory Visit: Admitting: Physician Assistant

## 2024-02-05 LAB — CBC WITH DIFFERENTIAL/PLATELET
Basophils Absolute: 0.1 x10E3/uL (ref 0.0–0.2)
Basos: 1 %
EOS (ABSOLUTE): 0.1 x10E3/uL (ref 0.0–0.4)
Eos: 3 %
Hematocrit: 43.4 % (ref 34.0–46.6)
Hemoglobin: 14.5 g/dL (ref 11.1–15.9)
Immature Grans (Abs): 0 x10E3/uL (ref 0.0–0.1)
Immature Granulocytes: 0 %
Lymphocytes Absolute: 1.7 x10E3/uL (ref 0.7–3.1)
Lymphs: 34 %
MCH: 30.6 pg (ref 26.6–33.0)
MCHC: 33.4 g/dL (ref 31.5–35.7)
MCV: 92 fL (ref 79–97)
Monocytes Absolute: 0.4 x10E3/uL (ref 0.1–0.9)
Monocytes: 8 %
Neutrophils Absolute: 2.6 x10E3/uL (ref 1.4–7.0)
Neutrophils: 54 %
Platelets: 293 x10E3/uL (ref 150–450)
RBC: 4.74 x10E6/uL (ref 3.77–5.28)
RDW: 13.5 % (ref 11.7–15.4)
WBC: 4.8 x10E3/uL (ref 3.4–10.8)

## 2024-02-05 LAB — IGE: IgE (Immunoglobulin E), Serum: 179 [IU]/mL (ref 6–495)

## 2024-02-06 ENCOUNTER — Other Ambulatory Visit: Payer: Self-pay | Admitting: Internal Medicine

## 2024-02-06 ENCOUNTER — Other Ambulatory Visit: Payer: Self-pay | Admitting: Physician Assistant

## 2024-02-06 ENCOUNTER — Ambulatory Visit: Payer: Self-pay | Admitting: Internal Medicine

## 2024-02-06 DIAGNOSIS — R053 Chronic cough: Secondary | ICD-10-CM

## 2024-02-06 DIAGNOSIS — G8929 Other chronic pain: Secondary | ICD-10-CM

## 2024-02-06 DIAGNOSIS — F411 Generalized anxiety disorder: Secondary | ICD-10-CM

## 2024-02-06 DIAGNOSIS — F321 Major depressive disorder, single episode, moderate: Secondary | ICD-10-CM

## 2024-02-08 NOTE — Progress Notes (Signed)
 Pt states she did not request a refill prednisone , she has not had time to take it. Pharmacy must be confused with the conflict of cough syrup that was prescribed being to expensive for pt and asking for alternative   Called and relayed results of labs to pt and she confirmed her understanding

## 2024-02-08 NOTE — Progress Notes (Signed)
 Atc x1 to relay results lmtcb  Received a fax that pt is also requesting prednisone  (14)

## 2024-02-10 ENCOUNTER — Ambulatory Visit: Admitting: Physician Assistant

## 2024-02-10 ENCOUNTER — Encounter: Payer: Self-pay | Admitting: Physician Assistant

## 2024-02-10 VITALS — BP 130/87 | HR 96 | Resp 16 | Ht 60.0 in | Wt 181.0 lb

## 2024-02-10 DIAGNOSIS — G8929 Other chronic pain: Secondary | ICD-10-CM

## 2024-02-10 DIAGNOSIS — F411 Generalized anxiety disorder: Secondary | ICD-10-CM | POA: Diagnosis not present

## 2024-02-10 DIAGNOSIS — M545 Low back pain, unspecified: Secondary | ICD-10-CM | POA: Diagnosis not present

## 2024-02-10 MED ORDER — MELOXICAM 15 MG PO TABS: 15.0000 mg | ORAL_TABLET | Freq: Every day | ORAL | 2 refills | Status: DC

## 2024-02-10 MED ORDER — HYDROXYZINE HCL 25 MG PO TABS: 25.0000 mg | ORAL_TABLET | Freq: Three times a day (TID) | ORAL | 1 refills | Status: DC | PRN

## 2024-02-10 MED ORDER — CYCLOBENZAPRINE HCL 10 MG PO TABS: 10.0000 mg | ORAL_TABLET | Freq: Every day | ORAL | 1 refills | Status: AC

## 2024-02-10 MED ORDER — ARIPIPRAZOLE 10 MG PO TABS: 10.0000 mg | ORAL_TABLET | Freq: Every day | ORAL | 3 refills | Status: DC

## 2024-02-10 NOTE — Assessment & Plan Note (Signed)
 Increased nighttime anxiety over 2-3 weeks. No medication side effects. Hydroxyzine  tablets expected to aid nighttime anxiety. Abilify  dose remains at 10 mg. - Prescribe hydroxyzine  25 mg tablets, take one at dinner, may increase to two as needed. - Continue Abilify  10 mg daily. - Provide therapy and psychiatry contact numbers for appointments. - Follow up in 3 months or sooner if anxiety worsens.

## 2024-02-10 NOTE — Telephone Encounter (Signed)
 Copied from CRM #[id number]. Topic: Clinical - Lab/Test Results >> Feb 08, 2024 11:25 AM Devaughn RAMAN wrote: Reason for CRM: Patient returning Tatisha Cerino's phone call regarding results. Informed patient of results however she had additional questions and needed clarification of results, contacted [facility] [place] spoke with [place] attempted to get Keitha Kolk currently with patients and stated she would follow up with patient. Advised patient. Patient was thankful and verbalized understanding. >> Feb 08, 2024  3:02 PM Whitney O wrote: Patient is returning a call from [place] concerning some results . Calling [facility] now speaking with Ms Shawnee to see if lucie is available lucie is with a patient . She will give patient a call back    Is there a cheaper cough medication for this pt she is requesting - ATC pt regarding her questuions about her results that were read to her the other day. lmtcb

## 2024-02-10 NOTE — Progress Notes (Signed)
 Established Patient Office Visit  Subjective   Patient ID: Kathryn Koch, female    DOB: 05/15/1965  Age: 58 y.o. MRN: [id number]  Chief Complaint  Patient presents with   Anxiety    Pt states her anxiety has gotten worse the last couple of weeks especially at night when she has panic attacks    Back Pain    Pt complains of ongoing back pain that has gotten severe over the last week. Describes it as an aching pain in the middle of her back.    Discussed the use of AI scribe software for clinical note transcription with the patient, who gave verbal consent to proceed.  History of Present Illness JENAN ELLEGOOD is a 58 year old female who presents with increased anxiety and back pain.  Anxiety has significantly increased over the past two to three weeks, worsening at night. She is currently taking Abilify  10 mg and Celexa  without side effects. Hydroxyzine  was prescribed, but she prefers tablets over capsules.  Severe back pain began worsening about a week and a half ago, described as 'straight down the spine' with a sensation of heat. The pain affects her mobility, making it difficult to get out of bed. She uses Mobic  and Flexeril  with moderate relief and has tried hot baths, ice baths, and stretching exercises. An x-ray of her back was done in June. There is no numbness or tingling down the legs.  She completed a course of steroids for chronic cough, which provided temporary relief of back pain. She prefers to avoid public places and has difficulty going out due to anxiety.     Review of Systems  Constitutional:  Negative for fever and malaise/fatigue.  Musculoskeletal:  Positive for back pain. Negative for myalgias and neck pain.  Neurological:  Negative for tingling, sensory change and weakness.  Psychiatric/Behavioral:  Positive for depression. Negative for suicidal ideas. The patient is nervous/anxious.       Objective:     BP 130/87   Pulse 96   Resp 16   Ht 5' (1.524 m)    Wt 181 lb 0.6 oz (82.1 kg)   SpO2 98%   BMI 35.36 kg/m    Physical Exam Constitutional:      General: She is not in acute distress.    Appearance: Normal appearance. She is obese. She is not ill-appearing.  HENT:     Head: Normocephalic and atraumatic.     Mouth/Throat:     Mouth: Mucous membranes are moist.     Pharynx: Oropharynx is clear.  Eyes:     Extraocular Movements: Extraocular movements intact.     Conjunctiva/sclera: Conjunctivae normal.  Cardiovascular:     Rate and Rhythm: Normal rate and regular rhythm.     Heart sounds: Normal heart sounds. No murmur heard. Pulmonary:     Effort: Pulmonary effort is normal.     Breath sounds: Normal breath sounds.  Musculoskeletal:     Lumbar back: Tenderness present. No swelling, edema, deformity or spasms. Normal range of motion.  Skin:    General: Skin is warm and dry.  Neurological:     General: No focal deficit present.     Mental Status: She is alert and oriented to person, place, and time.  Psychiatric:        Mood and Affect: Mood is anxious.     No results found for any visits on 02/10/24.  The ASCVD Risk score (Arnett DK, et al., 2019) failed to calculate  for the following reasons:   Cannot find a previous HDL lab   Cannot find a previous total cholesterol lab    Assessment & Plan:   Return in about 3 months (around 05/12/2024) for anxiety.   Generalized anxiety disorder Assessment & Plan: Increased nighttime anxiety over 2-3 weeks. No medication side effects. Hydroxyzine  tablets expected to aid nighttime anxiety. Abilify  dose remains at 10 mg. - Prescribe hydroxyzine  25 mg tablets, take one at dinner, may increase to two as needed. - Continue Abilify  10 mg daily. - Provide therapy and psychiatry contact numbers for appointments. - Follow up in 3 months or sooner if anxiety worsens.  Orders: -     hydrOXYzine  HCl; Take 1 tablet (25 mg total) by mouth 3 (three) times daily as needed.  Dispense: 45  tablet; Refill: 1 -     ARIPiprazole ; Take 1 tablet (10 mg total) by mouth daily.  Dispense: 30 tablet; Refill: 3  Chronic bilateral low back pain without sciatica Assessment & Plan: Chronic low back pain worsened over the past week. Previous x-rays confirmed degenerative changes. Current pain management insufficient. Physical therapy referral made. - Refill meloxicam  and Flexeril . - Encourage home exercises and stretches. - Provide additional back exercises handout. - Advise Tylenol  as needed for pain relief. - Encourage follow-up with orthopedics and physical therapy. - Discuss potential for back injections if pain persists.  Orders: -     Cyclobenzaprine  HCl; Take 1 tablet (10 mg total) by mouth at bedtime.  Dispense: 30 tablet; Refill: 1 -     Meloxicam ; Take 1 tablet (15 mg total) by mouth daily.  Dispense: 30 tablet; Refill: 2    Francella Barnett, PA-C

## 2024-02-10 NOTE — Assessment & Plan Note (Signed)
 Chronic low back pain worsened over the past week. Previous x-rays confirmed degenerative changes. Current pain management insufficient. Physical therapy referral made. - Refill meloxicam  and Flexeril . - Encourage home exercises and stretches. - Provide additional back exercises handout. - Advise Tylenol  as needed for pain relief. - Encourage follow-up with orthopedics and physical therapy. - Discuss potential for back injections if pain persists.

## 2024-02-12 NOTE — Progress Notes (Signed)
 The hydrocodone  cough syrup she is unable to pay for - any alternative at cheaper cost that you know of ?

## 2024-02-12 NOTE — Progress Notes (Signed)
 Atc x1 lmtcb

## 2024-02-16 NOTE — Telephone Encounter (Signed)
 Copied from CRM #[id number]. Topic: Clinical - Prescription Issue >> Feb 08, 2024 11:29 AM Kathryn Koch wrote: Reason for CRM: Patient called and stated she needs a cheaper medication that Medicaid will pay for she cannot afford $60 for the  Hycodan cough syrup.  Atc x1 regarding dr werts recommendations regarding cough med

## 2024-03-06 ENCOUNTER — Other Ambulatory Visit: Payer: Self-pay | Admitting: Physician Assistant

## 2024-03-06 DIAGNOSIS — F321 Major depressive disorder, single episode, moderate: Secondary | ICD-10-CM

## 2024-03-06 DIAGNOSIS — F411 Generalized anxiety disorder: Secondary | ICD-10-CM

## 2024-03-08 ENCOUNTER — Ambulatory Visit: Admitting: Physical Medicine and Rehabilitation

## 2024-03-14 ENCOUNTER — Encounter: Payer: Self-pay | Admitting: Radiology

## 2024-03-15 ENCOUNTER — Ambulatory Visit: Admitting: Internal Medicine

## 2024-04-06 ENCOUNTER — Other Ambulatory Visit: Payer: Self-pay | Admitting: Physician Assistant

## 2024-04-06 DIAGNOSIS — G8929 Other chronic pain: Secondary | ICD-10-CM

## 2024-04-28 ENCOUNTER — Other Ambulatory Visit: Payer: Self-pay | Admitting: Physician Assistant

## 2024-04-28 DIAGNOSIS — G8929 Other chronic pain: Secondary | ICD-10-CM

## 2024-04-30 ENCOUNTER — Other Ambulatory Visit: Payer: Self-pay | Admitting: Internal Medicine

## 2024-04-30 DIAGNOSIS — R053 Chronic cough: Secondary | ICD-10-CM

## 2024-05-16 ENCOUNTER — Encounter: Payer: Self-pay | Admitting: Physician Assistant

## 2024-05-16 ENCOUNTER — Ambulatory Visit: Admitting: Physician Assistant

## 2024-05-16 VITALS — BP 127/83 | HR 90 | Temp 98.1°F | Ht 60.0 in | Wt 181.0 lb

## 2024-05-16 DIAGNOSIS — Z20828 Contact with and (suspected) exposure to other viral communicable diseases: Secondary | ICD-10-CM | POA: Insufficient documentation

## 2024-05-16 DIAGNOSIS — F411 Generalized anxiety disorder: Secondary | ICD-10-CM

## 2024-05-16 DIAGNOSIS — R350 Frequency of micturition: Secondary | ICD-10-CM | POA: Diagnosis not present

## 2024-05-16 MED ORDER — OSELTAMIVIR PHOSPHATE 75 MG PO CAPS: 75.0000 mg | ORAL_CAPSULE | Freq: Every day | ORAL | 0 refills | Status: AC

## 2024-05-16 MED ORDER — HYDROXYZINE HCL 25 MG PO TABS: ORAL_TABLET | ORAL | 1 refills | Status: AC

## 2024-05-16 MED ORDER — ARIPIPRAZOLE 10 MG PO TABS: 10.0000 mg | ORAL_TABLET | Freq: Every day | ORAL | 3 refills | Status: AC

## 2024-05-16 NOTE — Assessment & Plan Note (Signed)
 Symptoms suggest viral etiology, likely flu based on contact. No antibiotics indicated. - Prescribed Tamiflu  once daily for five days. Discussed potential side effects. - Provided Mucinex samples. - Recommended Flonase nasal spray for left ear congestion. - Advised follow-up if symptoms persist beyond one to two weeks.

## 2024-05-16 NOTE — Assessment & Plan Note (Signed)
 Anxiety persists but is stable at this time. Hydroxyzine  effective in the evenings. - Continue Abilify  and Celexa . Medications refilled.  - Adjusted hydroxyzine  to 25 mg at bedtime, option to increase to 2 tablets if needed. - Follow up in 6 months or sooner for new symptoms.

## 2024-05-16 NOTE — Assessment & Plan Note (Signed)
 Patient presents today with urinary frequency and urgency for 1.5 weeks. Overall reassuring exam.  - Urine collected for urinalysis and urine culture. - Treatment pending results. - Follow up for worsening symptoms such as fever, N/V, hematuria, or flank pain.

## 2024-05-16 NOTE — Progress Notes (Signed)
 "  Established Patient Office Visit  Subjective   Patient ID: Kathryn Koch, female    DOB: Oct 30, 1965  Age: 59 y.o. MRN: [id number]  Chief Complaint  Patient presents with   Follow-up    Patient is here for a 3 month follow up.  Patient stated she feels like she is having a sinus infection and ear pain in the left ear     Discussed the use of AI scribe software for clinical note transcription with the patient, who gave verbal consent to proceed.  History of Present Illness Kathryn Koch is a 59 year old female who presents with urinary symptoms, earache, and body aches.  She has had urinary urgency for about 1.5 weeks, with frequent urge to void shortly after urinating. Denies fever or dysuria.   She has had left ear discomfort for a couple of weeks. It was previously warm, swollen, and tender to touch but is no longer warm or painful. She now mainly feels that the ear is stopped up.  She has had body aches for the past 2 days. Yesterday and this morning she expectorated mucus with some blood and has a sinus headache. Bayer aspirin, Tylenol , and Alka-Seltzer cold medicine have not relieved her symptoms.  Her anxiety worsens at night. She takes Abilify  and 25 mg hydroxyzine  at bedtime, with the option to take a second hydroxyzine  tablet, which she finds helpful. States anxiety is stable at this time.   Her daughter has had influenza twice in the past 6 weeks, and she saw her daughter yesterday. She is worried about getting the flu, as she often gets sick after contact with her grandchildren.    Review of Systems  Constitutional:  Positive for activity change and fatigue. Negative for appetite change and fever.  HENT:  Positive for congestion, ear pain and sinus pain. Negative for ear discharge.   Respiratory:  Positive for cough. Negative for shortness of breath.   Cardiovascular:  Negative for chest pain.  Gastrointestinal:  Negative for nausea and vomiting.  Genitourinary:   Positive for frequency and urgency. Negative for dysuria.  Musculoskeletal:  Positive for arthralgias and myalgias. Negative for back pain and gait problem.  Neurological:  Positive for headaches. Negative for light-headedness.  Psychiatric/Behavioral:  The patient is nervous/anxious (stable).        Objective:     BP 127/83 (BP Location: Left Arm, Patient Position: Sitting)   Pulse 90   Temp 98.1 F (36.7 C)   Ht 5' (1.524 m)   Wt 181 lb (82.1 kg)   SpO2 98%   BMI 35.35 kg/m    Physical Exam Constitutional:      General: She is not in acute distress.    Appearance: Normal appearance. She is obese. She is not ill-appearing.  HENT:     Head: Normocephalic and atraumatic.     Right Ear: Tympanic membrane and ear canal normal.     Left Ear: Tympanic membrane and ear canal normal.     Mouth/Throat:     Mouth: Mucous membranes are moist.     Pharynx: Oropharynx is clear.  Eyes:     Extraocular Movements: Extraocular movements intact.     Conjunctiva/sclera: Conjunctivae normal.  Cardiovascular:     Rate and Rhythm: Normal rate and regular rhythm.     Heart sounds: Normal heart sounds. No murmur heard. Pulmonary:     Effort: Pulmonary effort is normal.     Breath sounds: Normal breath sounds. No wheezing, rhonchi  or rales.  Abdominal:     Tenderness: There is no abdominal tenderness. There is no right CVA tenderness or left CVA tenderness.  Skin:    General: Skin is warm and dry.  Neurological:     General: No focal deficit present.     Mental Status: She is alert and oriented to person, place, and time.  Psychiatric:        Mood and Affect: Mood normal.        Behavior: Behavior normal.     No results found for any visits on 05/16/24.  The ASCVD Risk score (Arnett DK, et al., 2019) failed to calculate for the following reasons:   Cannot find a previous HDL lab   Cannot find a previous total cholesterol lab   * - Cholesterol units were assumed    Assessment &  Plan:   Return in about 6 months (around 11/13/2024) for sooner as needed .   Generalized anxiety disorder Assessment & Plan: Anxiety persists but is stable at this time. Hydroxyzine  effective in the evenings. - Continue Abilify  and Celexa . Medications refilled.  - Adjusted hydroxyzine  to 25 mg at bedtime, option to increase to 2 tablets if needed. - Follow up in 6 months or sooner for new symptoms.   Orders: -     ARIPiprazole ; Take 1 tablet (10 mg total) by mouth daily.  Dispense: 30 tablet; Refill: 3 -     hydrOXYzine  HCl; Take 1-2 tablets by mouth at bed time as needed for anxiety and panic attacks.  Dispense: 60 tablet; Refill: 1  Contact with and (suspected) exposure to other viral communicable diseases Assessment & Plan: Symptoms suggest viral etiology, likely flu based on contact. No antibiotics indicated. - Prescribed Tamiflu  once daily for five days. Discussed potential side effects. - Provided Mucinex samples. - Recommended Flonase nasal spray for left ear congestion. - Advised follow-up if symptoms persist beyond one to two weeks.  Orders: -     Oseltamivir  Phosphate; Take 1 capsule (75 mg total) by mouth daily for 7 days.  Dispense: 7 capsule; Refill: 0  Urine frequency Assessment & Plan: Patient presents today with urinary frequency and urgency for 1.5 weeks. Overall reassuring exam.  - Urine collected for urinalysis and urine culture. - Treatment pending results. - Follow up for worsening symptoms such as fever, N/V, hematuria, or flank pain.   Orders: -     Urinalysis -     Urine Culture   Charmaine Nejla Reasor, PA-C  "

## 2024-05-17 DIAGNOSIS — N3000 Acute cystitis without hematuria: Secondary | ICD-10-CM

## 2024-05-17 LAB — URINALYSIS
Bilirubin, UA: NEGATIVE
Glucose, UA: NEGATIVE
Ketones, UA: NEGATIVE
Nitrite, UA: POSITIVE — AB
Protein,UA: NEGATIVE
RBC, UA: NEGATIVE
Specific Gravity, UA: 1.023 (ref 1.005–1.030)
Urobilinogen, Ur: 0.2 mg/dL (ref 0.2–1.0)
pH, UA: 5 (ref 5.0–7.5)

## 2024-05-17 LAB — SPECIMEN STATUS REPORT

## 2024-05-17 MED ORDER — NITROFURANTOIN MONOHYD MACRO 100 MG PO CAPS: 100.0000 mg | ORAL_CAPSULE | Freq: Two times a day (BID) | ORAL | 0 refills | Status: AC

## 2024-05-19 LAB — URINE CULTURE

## 2024-05-23 ENCOUNTER — Other Ambulatory Visit: Payer: Self-pay | Admitting: Physician Assistant

## 2024-05-23 DIAGNOSIS — G8929 Other chronic pain: Secondary | ICD-10-CM

## 2024-11-14 ENCOUNTER — Ambulatory Visit: Admitting: Physician Assistant
